# Patient Record
Sex: Female | Born: 1990 | Race: Black or African American | Hispanic: No | Marital: Single | Smoking: Current every day smoker
Health system: Southern US, Community
[De-identification: ages and names within clinical notes are randomized; demographics above are authoritative.]

## PROBLEM LIST (undated history)

## (undated) ENCOUNTER — Inpatient Hospital Stay (HOSPITAL_COMMUNITY): Payer: Self-pay

## (undated) ENCOUNTER — Emergency Department (HOSPITAL_COMMUNITY): Admission: EM | Payer: Medicaid Other | Source: Home / Self Care

## (undated) DIAGNOSIS — G4700 Insomnia, unspecified: Secondary | ICD-10-CM

## (undated) DIAGNOSIS — IMO0001 Reserved for inherently not codable concepts without codable children: Secondary | ICD-10-CM

## (undated) DIAGNOSIS — D649 Anemia, unspecified: Secondary | ICD-10-CM

## (undated) DIAGNOSIS — Z8669 Personal history of other diseases of the nervous system and sense organs: Secondary | ICD-10-CM

## (undated) DIAGNOSIS — F419 Anxiety disorder, unspecified: Secondary | ICD-10-CM

## (undated) HISTORY — PX: FOOT SURGERY: SHX648

## (undated) HISTORY — DX: Anemia, unspecified: D64.9

---
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
## 2011-02-10 LAB — CBC
Platelets: 320 10*3/uL (ref 150–400)
RDW: 15.8 % — ABNORMAL HIGH (ref 11.5–15.5)
WBC: 9.4 10*3/uL (ref 4.0–10.5)

## 2011-02-10 LAB — GC/CHLAMYDIA PROBE AMP, GENITAL
Chlamydia, DNA Probe: NEGATIVE
GC Probe Amp, Genital: NEGATIVE

## 2011-02-10 LAB — POCT PREGNANCY, URINE: Preg Test, Ur: NEGATIVE

## 2011-09-01 ENCOUNTER — Inpatient Hospital Stay (INDEPENDENT_AMBULATORY_CARE_PROVIDER_SITE_OTHER)
Admission: RE | Admit: 2011-09-01 | Discharge: 2011-09-01 | Disposition: A | Discharge status: Home / Self Care | Payer: Medicaid Other | Source: Ambulatory Visit | Attending: Family Medicine | Admitting: Family Medicine

## 2011-09-01 DIAGNOSIS — Z0489 Encounter for examination and observation for other specified reasons: Secondary | ICD-10-CM

## 2011-09-01 DIAGNOSIS — N764 Abscess of vulva: Secondary | ICD-10-CM

## 2011-09-01 LAB — POCT URINALYSIS DIP (DEVICE)
Bilirubin Urine: NEGATIVE
Glucose, UA: NEGATIVE mg/dL
Hgb urine dipstick: NEGATIVE
Ketones, ur: NEGATIVE mg/dL
Leukocytes, UA: NEGATIVE
Nitrite: NEGATIVE
Protein, ur: NEGATIVE mg/dL
Specific Gravity, Urine: 1.02 (ref 1.005–1.030)
Urobilinogen, UA: 0.2 mg/dL (ref 0.0–1.0)
pH: 7 (ref 5.0–8.0)

## 2011-09-01 LAB — WET PREP, GENITAL
Trich, Wet Prep: NONE SEEN
Yeast Wet Prep HPF POC: NONE SEEN

## 2011-09-02 LAB — GC/CHLAMYDIA PROBE AMP, GENITAL
Chlamydia, DNA Probe: NEGATIVE
GC Probe Amp, Genital: NEGATIVE

## 2011-09-04 ENCOUNTER — Ambulatory Visit: Payer: Medicaid Other | Admitting: Obstetrics & Gynecology

## 2011-09-15 ENCOUNTER — Encounter: Payer: Self-pay | Admitting: *Deleted

## 2011-09-21 ENCOUNTER — Ambulatory Visit: Payer: Medicaid Other | Admitting: Family Medicine

## 2011-12-16 ENCOUNTER — Encounter (HOSPITAL_COMMUNITY): Payer: Self-pay | Admitting: *Deleted

## 2011-12-16 ENCOUNTER — Emergency Department (INDEPENDENT_AMBULATORY_CARE_PROVIDER_SITE_OTHER)
Admission: EM | Admit: 2011-12-16 | Discharge: 2011-12-16 | Disposition: A | Discharge status: Home / Self Care | Payer: Medicaid Other | Source: Home / Self Care | Attending: Family Medicine | Admitting: Family Medicine

## 2011-12-16 DIAGNOSIS — R6889 Other general symptoms and signs: Secondary | ICD-10-CM

## 2011-12-16 MED ORDER — ONDANSETRON HCL 4 MG PO TABS: 4.0000 mg | ORAL_TABLET | Freq: Four times a day (QID) | ORAL | Status: AC

## 2011-12-16 NOTE — ED Provider Notes (Signed)
History     CSN: [id number]  Arrival date & time 12/16/11  1726   First MD Initiated Contact with Patient 12/16/11 1822      Chief Complaint  Patient presents with  . Nausea  . Emesis    (Consider location/radiation/quality/duration/timing/severity/associated sxs/prior treatment) Patient is a 21 y.o. female presenting with vomiting. The history is provided by the patient.  Emesis  This is a new problem. The current episode started more than 2 days ago (started with fever, chills aching then n/v yest with diarrhea.). The problem has been gradually worsening. The emesis has an appearance of stomach contents. There has been no fever. Pertinent negatives include no abdominal pain, no cough, no diarrhea and no URI.    Past Medical History  Diagnosis Date  . Asthma   . Anemia     History reviewed. No pertinent past surgical history.  History reviewed. No pertinent family history.  History  Substance Use Topics  . Smoking status: Current Some Day Smoker    Types: Cigars  . Smokeless tobacco: Not on file  . Alcohol Use: Yes    OB History    Grav Para Term Preterm Abortions TAB SAB Ect Mult Living                  Review of Systems  Constitutional: Negative.   HENT: Negative.   Respiratory: Negative for cough.   Gastrointestinal: Positive for nausea and vomiting. Negative for abdominal pain, diarrhea, abdominal distention and anal bleeding.  Genitourinary: Negative.     Allergies  Review of patient's allergies indicates no known allergies.  Home Medications   Current Outpatient Rx  Name Route Sig Dispense Refill  . ALBUTEROL SULFATE HFA 108 (90 BASE) MCG/ACT IN AERS Inhalation Inhale 2 puffs into the lungs every 6 (six) hours as needed.    . ALBUTEROL SULFATE (2.5 MG/3ML) 0.083% IN NEBU Nebulization Take 2.5 mg by nebulization 2 (two) times daily as needed.      . AMBULATORY NON FORMULARY MEDICATION Oral Take 1 tablet by mouth daily. Medication Name: Trisprintec     . BUDESONIDE 180 MCG/ACT IN AEPB Inhalation Inhale 1 puff into the lungs 2 (two) times daily as needed.      Marland Kitchen MONTELUKAST SODIUM 10 MG PO TABS Oral Take 10 mg by mouth daily as needed.      Marland Kitchen ONDANSETRON HCL 4 MG PO TABS Oral Take 1 tablet (4 mg total) by mouth every 6 (six) hours. 8 tablet 0    BP 137/88  Pulse 92  Temp(Src) 98.6 F (37 C) (Oral)  Resp 16  SpO2 100%  LMP 12/14/2011  Physical Exam  Nursing note and vitals reviewed. Constitutional: She appears well-developed and well-nourished.  HENT:  Head: Normocephalic.  Right Ear: External ear normal.  Left Ear: External ear normal.  Mouth/Throat: Oropharynx is clear and moist.  Neck: Normal range of motion. Neck supple.  Cardiovascular: Normal rate, normal heart sounds and intact distal pulses.   Pulmonary/Chest: Effort normal and breath sounds normal.  Abdominal: Soft. Bowel sounds are normal. She exhibits no distension. There is no tenderness. There is no rebound and no guarding.  Lymphadenopathy:    She has no cervical adenopathy.  Skin: Skin is warm and dry.    ED Course  Procedures (including critical care time)  Labs Reviewed - No data to display No results found.   1. Influenza-like illness       MDM  Barkley Bruns, MD 12/18/11 [phone]

## 2012-07-05 ENCOUNTER — Inpatient Hospital Stay (HOSPITAL_COMMUNITY): Payer: Medicaid Other

## 2012-07-05 ENCOUNTER — Encounter (HOSPITAL_COMMUNITY): Payer: Self-pay

## 2012-07-05 ENCOUNTER — Inpatient Hospital Stay (HOSPITAL_COMMUNITY)
Admission: AD | Admit: 2012-07-05 | Discharge: 2012-07-05 | Disposition: A | Discharge status: Home / Self Care | Payer: Medicaid Other | Source: Ambulatory Visit | Attending: Family Medicine | Admitting: Family Medicine

## 2012-07-05 DIAGNOSIS — O26899 Other specified pregnancy related conditions, unspecified trimester: Secondary | ICD-10-CM

## 2012-07-05 DIAGNOSIS — O99891 Other specified diseases and conditions complicating pregnancy: Secondary | ICD-10-CM | POA: Insufficient documentation

## 2012-07-05 DIAGNOSIS — R109 Unspecified abdominal pain: Secondary | ICD-10-CM | POA: Insufficient documentation

## 2012-07-05 LAB — URINALYSIS, ROUTINE W REFLEX MICROSCOPIC
Bilirubin Urine: NEGATIVE
Hgb urine dipstick: NEGATIVE
Ketones, ur: NEGATIVE mg/dL
Nitrite: NEGATIVE
Protein, ur: NEGATIVE mg/dL
Specific Gravity, Urine: >1.03 — ABNORMAL HIGH (ref 1.005–1.030)
Urobilinogen, UA: 0.2 mg/dL (ref 0.0–1.0)

## 2012-07-05 LAB — WET PREP, GENITAL: Trich, Wet Prep: NONE SEEN

## 2012-07-05 LAB — CBC WITH DIFFERENTIAL/PLATELET
Basophils Relative: 0 % (ref 0–1)
Eosinophils Absolute: 0.1 10*3/uL (ref 0.0–0.7)
Eosinophils Relative: 1 % (ref 0–5)
Lymphs Abs: 3.2 10*3/uL (ref 0.7–4.0)
MCH: 23.3 pg — ABNORMAL LOW (ref 26.0–34.0)
MCHC: 31.1 g/dL (ref 30.0–36.0)
MCV: 74.9 fL — ABNORMAL LOW (ref 78.0–100.0)
Monocytes Relative: 7 % (ref 3–12)
Neutrophils Relative %: 49 % (ref 43–77)
Platelets: 240 10*3/uL (ref 150–400)
RBC: 4.38 MIL/uL (ref 3.87–5.11)

## 2012-07-05 LAB — URINE MICROSCOPIC-ADD ON

## 2012-07-05 LAB — HCG, QUANTITATIVE, PREGNANCY: hCG, Beta Chain, Quant, S: 1023 m[IU]/mL — ABNORMAL HIGH (ref <5)

## 2012-07-05 NOTE — MAU Provider Note (Signed)
History     CSN: [id number]  Arrival date and time: 07/05/12 1346   First Provider Initiated Contact with Patient 07/05/12 1555      Chief Complaint  Patient presents with  . Abdominal Pain   HPI Tiffany [name] is a 22 y.o. female @ [redacted]w[redacted]d gestation who presents to MAU with abdominal pain. The pain started last night. She describes the pain as sharp that comes and goes. She rates the pain as 8/10. The pain is located in the left lower area of the abdomen. Associated symptoms include nausea, frequent urination and low back pain. Last pap smear = never had one. No history of STI's. Current sex partner x 2 years, no birth control. The history was provided by the patient.  OB History    Grav Para Term Preterm Abortions TAB SAB Ect Mult Living   1               Past Medical History  Diagnosis Date  . Asthma   . Anemia     Past Surgical History  Procedure Date  . No past surgeries     Family History  Problem Relation Age of Onset  . Other Neg Hx     History  Substance Use Topics  . Smoking status: Current Some Day Smoker    Types: Cigars  . Smokeless tobacco: Never Used  . Alcohol Use: No    Allergies: No Known Allergies  Prescriptions prior to admission  Medication Sig Dispense Refill  . acetaminophen (TYLENOL) 500 MG tablet Take 500 mg by mouth every 6 (six) hours as needed. For pain      . ibuprofen (ADVIL,MOTRIN) 200 MG tablet Take 400 mg by mouth every 6 (six) hours as needed. For pain      . albuterol (PROVENTIL HFA;VENTOLIN HFA) 108 (90 BASE) MCG/ACT inhaler Inhale 2 puffs into the lungs every 6 (six) hours as needed. For wheezing or shortness of breath        Review of Systems  Constitutional: Negative for fever, chills and weight loss.  HENT: Negative for ear pain, nosebleeds, congestion, sore throat and neck pain.   Eyes: Negative for blurred vision, double vision, photophobia and pain.  Respiratory: Negative for cough, shortness of breath and wheezing.     Cardiovascular: Negative for chest pain, palpitations and leg swelling.  Gastrointestinal: Positive for nausea and abdominal pain. Negative for heartburn, vomiting, diarrhea and constipation.  Genitourinary: Positive for frequency. Negative for dysuria and urgency.  Musculoskeletal: Positive for back pain. Negative for myalgias.  Skin: Negative for itching and rash.  Neurological: Positive for headaches. Negative for dizziness, sensory change, speech change, seizures and weakness.  Endo/Heme/Allergies: Does not bruise/bleed easily.  Psychiatric/Behavioral: Negative for depression. The patient is not nervous/anxious and does not have insomnia.    Physical Exam   Blood pressure 117/61, pulse 94, temperature 100 F (37.8 C), temperature source Oral, resp. rate 16, height 5\' 4"  (1.626 m), weight 142 lb (64.411 kg), last menstrual period 05/24/2012.  Physical Exam  Constitutional: She is oriented to person, place, and time. She appears well-developed and well-nourished. No distress.  HENT:  Head: Normocephalic and atraumatic.  Eyes: EOM are normal.  Neck: Neck supple.  Cardiovascular: Normal rate.   Respiratory: Effort normal.  GI: Soft. There is tenderness.       Mildly tender left lower quadrant.   Genitourinary:       External genitalia without lesions. White discharge vaginal vault. Cervix closed, long, no CMT,  mild left adnexal tenderness. Uterus slightly enlarged.   Musculoskeletal: Normal range of motion.  Neurological: She is alert and oriented to person, place, and time.  Skin: Skin is warm and dry.  Psychiatric: She has a normal mood and affect. Her behavior is normal. Judgment and thought content normal.   Results for orders placed during the hospital encounter of 07/05/12 (from the past 24 hour(s))  URINALYSIS, ROUTINE W REFLEX MICROSCOPIC     Status: Abnormal   Collection Time   07/05/12  2:00 PM      Component Value Range   Color, Urine YELLOW  YELLOW   APPearance HAZY  (*) CLEAR   Specific Gravity, Urine >1.030 (*) 1.005 - 1.030   pH 6.0  5.0 - 8.0   Glucose, UA NEGATIVE  NEGATIVE mg/dL   Hgb urine dipstick NEGATIVE  NEGATIVE   Bilirubin Urine NEGATIVE  NEGATIVE   Ketones, ur NEGATIVE  NEGATIVE mg/dL   Protein, ur NEGATIVE  NEGATIVE mg/dL   Urobilinogen, UA 0.2  0.0 - 1.0 mg/dL   Nitrite NEGATIVE  NEGATIVE   Leukocytes, UA TRACE (*) NEGATIVE  URINE MICROSCOPIC-ADD ON     Status: Abnormal   Collection Time   07/05/12  2:00 PM      Component Value Range   Squamous Epithelial / LPF MANY (*) RARE   WBC, UA 0-2  <3 WBC/hpf   RBC / HPF 0-2  <3 RBC/hpf   Urine-Other MUCOUS PRESENT    POCT PREGNANCY, URINE     Status: Abnormal   Collection Time   07/05/12  3:22 PM      Component Value Range   Preg Test, Ur POSITIVE (*) NEGATIVE  WET PREP, GENITAL     Status: Abnormal   Collection Time   07/05/12  4:11 PM      Component Value Range   Yeast Wet Prep HPF POC NONE SEEN  NONE SEEN   Trich, Wet Prep NONE SEEN  NONE SEEN   Clue Cells Wet Prep HPF POC FEW (*) NONE SEEN   WBC, Wet Prep HPF POC FEW (*) NONE SEEN  CBC WITH DIFFERENTIAL     Status: Abnormal   Collection Time   07/05/12  4:11 PM      Component Value Range   WBC 7.6  4.0 - 10.5 K/uL   RBC 4.38  3.87 - 5.11 MIL/uL   Hemoglobin 10.2 (*) 12.0 - 15.0 g/dL   HCT 29.5 (*) 62.1 - 30.8 %   MCV 74.9 (*) 78.0 - 100.0 fL   MCH 23.3 (*) 26.0 - 34.0 pg   MCHC 31.1  30.0 - 36.0 g/dL   RDW 65.7 (*) 84.6 - 96.2 %   Platelets 240  150 - 400 K/uL   Neutrophils Relative 49  43 - 77 %   Neutro Abs 3.7  1.7 - 7.7 K/uL   Lymphocytes Relative 42  12 - 46 %   Lymphs Abs 3.2  0.7 - 4.0 K/uL   Monocytes Relative 7  3 - 12 %   Monocytes Absolute 0.6  0.1 - 1.0 K/uL   Eosinophils Relative 1  0 - 5 %   Eosinophils Absolute 0.1  0.0 - 0.7 K/uL   Basophils Relative 0  0 - 1 %   Basophils Absolute 0.0  0.0 - 0.1 K/uL  HCG, QUANTITATIVE, PREGNANCY     Status: Abnormal   Collection Time   07/05/12  4:11 PM      Component  Value Range  hCG, Beta Chain, Quant, S 1023 (*) <5 mIU/mL  ABO/RH     Status: Normal   Collection Time   07/05/12  4:11 PM      Component Value Range   ABO/RH(D) A POS     US Ob Comp Less 14 Wks  07/05/2012  *RADIOLOGY REPORT*  Clinical Data: Left pelvic pain for 1 month  OBSTETRIC <14 WK [place] AND TRANSVAGINAL OB US  Technique:  Both transabdominal and transvaginal ultrasound examinations were performed for complete evaluation of the gestation as well as the maternal uterus, adnexal regions, and pelvic cul-de-sac.  Transvaginal technique was performed to assess early pregnancy.  Comparison:  None.  Intrauterine gestational sac:  Not visualized Yolk sac: Not visualized Embryo: Not visualized Cardiac Activity: Not detected  Maternal uterus/adnexae: Homogeneous thickened and echogenic endometrium measuring 17 mm. No visualized intrauterine gestational sac or IUP.  No adnexal abnormality or free fluid.  Left ovary measures 1.9 x 3.7 x 1.9 cm. Small follicles evident.  Right ovary measures 5.3 x 3.2 x 3.1 cm. Small corpus luteum cyst noted.  IMPRESSION: No visualized intrauterine gestational sac or IUP.  Thickened endometrium noted.  This may be secondary to a pregnancy too early to visualize.  No free fluid or adnexal abnormality appreciated.  Original Report Authenticated By: Judie Petit. Ruel Favors, M.D.   US Ob Transvaginal  07/05/2012  *RADIOLOGY REPORT*  Clinical Data: Left pelvic pain for 1 month  OBSTETRIC <14 WK [place] AND TRANSVAGINAL OB US  Technique:  Both transabdominal and transvaginal ultrasound examinations were performed for complete evaluation of the gestation as well as the maternal uterus, adnexal regions, and pelvic cul-de-sac.  Transvaginal technique was performed to assess early pregnancy.  Comparison:  None.  Intrauterine gestational sac:  Not visualized Yolk sac: Not visualized Embryo: Not visualized Cardiac Activity: Not detected  Maternal uterus/adnexae: Homogeneous thickened and echogenic  endometrium measuring 17 mm. No visualized intrauterine gestational sac or IUP.  No adnexal abnormality or free fluid.  Left ovary measures 1.9 x 3.7 x 1.9 cm. Small follicles evident.  Right ovary measures 5.3 x 3.2 x 3.1 cm. Small corpus luteum cyst noted.  IMPRESSION: No visualized intrauterine gestational sac or IUP.  Thickened endometrium noted.  This may be secondary to a pregnancy too early to visualize.  No free fluid or adnexal abnormality appreciated.  Original Report Authenticated By: Judie Petit. Ruel Favors, M.D.   Assessment: 21 y.o. female with abdominal pain in early pregnancy    Plan:  Return in 2 days for follow up Bhcg. If normal rise will schedule ultrasound for one week later.   Ectopic precautions.    Stop ibuprofen Procedures  Follow-up Information    Follow up with [facility] in 2 days.   Contact information:   [address] [place] [address] [phone]         [place], [place]  Home Medication Instructions [facility]:[id number]   Printed on:07/05/12 1738  Medication Information                    albuterol (PROVENTIL HFA;VENTOLIN HFA) 108 (90 BASE) MCG/ACT inhaler Inhale 2 puffs into the lungs every 6 (six) hours as needed. For wheezing or shortness of breath           acetaminophen (TYLENOL) 500 MG tablet Take 500 mg by mouth every 6 (six) hours as needed. For pain           ibuprofen (ADVIL,MOTRIN) 200 MG tablet Take 400 mg  by mouth every 6 (six) hours as needed. For pain            NEESE,HOPE, RN, FNP, [facility] 07/05/2012, 5:18 PM

## 2012-07-05 NOTE — MAU Provider Note (Signed)
Chart reviewed and agree with management and plan.  

## 2012-07-05 NOTE — MAU Note (Signed)
Pt states pain x1 month intermittently, alternates sides however mostly noted on pt's left side. Denies abnormal vaginal discharge or bleeding.

## 2012-07-05 NOTE — MAU Note (Signed)
Pt states, " I've had low abdominal pain off and on for a month and I've missed my period."

## 2012-07-06 LAB — GC/CHLAMYDIA PROBE AMP, GENITAL
Chlamydia, DNA Probe: NEGATIVE
GC Probe Amp, Genital: NEGATIVE

## 2012-07-07 ENCOUNTER — Inpatient Hospital Stay (HOSPITAL_COMMUNITY)
Admission: AD | Admit: 2012-07-07 | Discharge: 2012-07-07 | Disposition: A | Discharge status: Home / Self Care | Payer: Medicaid Other | Source: Ambulatory Visit | Attending: Obstetrics & Gynecology | Admitting: Obstetrics & Gynecology

## 2012-07-07 DIAGNOSIS — R109 Unspecified abdominal pain: Secondary | ICD-10-CM | POA: Insufficient documentation

## 2012-07-07 DIAGNOSIS — Z331 Pregnant state, incidental: Secondary | ICD-10-CM

## 2012-07-07 DIAGNOSIS — Z3490 Encounter for supervision of normal pregnancy, unspecified, unspecified trimester: Secondary | ICD-10-CM

## 2012-07-07 DIAGNOSIS — O99891 Other specified diseases and conditions complicating pregnancy: Secondary | ICD-10-CM | POA: Insufficient documentation

## 2012-07-07 NOTE — MAU Note (Signed)
Patient to MAU for follow up BHCG for follow up BHCG. Patient states she is having some sharp/achey lower abdominal pain on and off but no bleeding or discharge.

## 2012-07-07 NOTE — MAU Provider Note (Signed)
  History     CSN: [id number]  Arrival date and time: 07/07/12 1316   None     Chief Complaint  Patient presents with  . Follow-up   HPI This is a 21 y.o. female at [redacted]w[redacted]d who presents for follow-up Quant HCG level. Denies complaints  OB History    Grav Para Term Preterm Abortions TAB SAB Ect Mult Living   1               Past Medical History  Diagnosis Date  . Asthma   . Anemia     Past Surgical History  Procedure Date  . No past surgeries     Family History  Problem Relation Age of Onset  . Other Neg Hx     History  Substance Use Topics  . Smoking status: Current Some Day Smoker    Types: Cigars  . Smokeless tobacco: Never Used  . Alcohol Use: No    Allergies: No Known Allergies  Prescriptions prior to admission  Medication Sig Dispense Refill  . acetaminophen (TYLENOL) 500 MG tablet Take 500 mg by mouth every 6 (six) hours as needed. For pain      . albuterol (PROVENTIL HFA;VENTOLIN HFA) 108 (90 BASE) MCG/ACT inhaler Inhale 2 puffs into the lungs every 6 (six) hours as needed. For wheezing or shortness of breath      . DISCONTD: ibuprofen (ADVIL,MOTRIN) 200 MG tablet Take 400 mg by mouth every 6 (six) hours as needed. For pain        ROS Denies  Physical Exam   Blood pressure 122/66, pulse 98, temperature 98.4 F (36.9 C), temperature source Oral, resp. rate 16, last menstrual period 05/24/2012, SpO2 100.00%.  Physical Exam  Constitutional: She is oriented to person, place, and time. She appears well-developed and well-nourished. No distress.  Respiratory: Effort normal.  Neurological: She is alert and oriented to person, place, and time.  Skin: Skin is warm and dry.  Psychiatric: She has a normal mood and affect.   Results for orders placed during the hospital encounter of 07/07/12 (from the past 24 hour(s))  HCG, QUANTITATIVE, PREGNANCY     Status: Abnormal   Collection Time   07/07/12  1:27 PM      Component Value Range   hCG, Beta Chain,  Quant, S 2861 (*) <5 mIU/mL   Last Quant 1021   MAU Course  Procedures  Assessment and Plan  A:  Pregnancy at [redacted]w[redacted]d by LMP      Appropriately rising Quant HCG  P:  Per previous plan, since Quant rose appropriately today, will have her return Tuesday for F/U US  [facility] 07/07/2012, 2:14 PM

## 2012-07-12 ENCOUNTER — Inpatient Hospital Stay (HOSPITAL_COMMUNITY): Payer: Medicaid Other

## 2012-07-12 ENCOUNTER — Inpatient Hospital Stay (HOSPITAL_COMMUNITY)
Admission: AD | Admit: 2012-07-12 | Discharge: 2012-07-12 | Disposition: A | Discharge status: Home / Self Care | Payer: Medicaid Other | Source: Ambulatory Visit | Attending: Obstetrics & Gynecology | Admitting: Obstetrics & Gynecology

## 2012-07-12 DIAGNOSIS — O200 Threatened abortion: Secondary | ICD-10-CM

## 2012-07-12 NOTE — MAU Note (Signed)
Patient to MAU for a follow up ultrasound for viability. Patient denies any pain or bleeding.

## 2012-07-12 NOTE — MAU Provider Note (Signed)
  History     CSN: [id number]  Arrival date and time: 07/12/12 1200   None     Chief Complaint  Patient presents with  . Follow-up   HPI 21 y.o. G1P0 at [redacted]w[redacted]d here for f/u u/s. Seen in MAU originally with abdominal pain, had normal rise in quant, denies pain or bleeding today.    Past Medical History  Diagnosis Date  . Asthma   . Anemia     Past Surgical History  Procedure Date  . No past surgeries     Family History  Problem Relation Age of Onset  . Other Neg Hx     History  Substance Use Topics  . Smoking status: Current Some Day Smoker    Types: Cigars  . Smokeless tobacco: Never Used  . Alcohol Use: No    Allergies: No Known Allergies  Prescriptions prior to admission  Medication Sig Dispense Refill  . acetaminophen (TYLENOL) 500 MG tablet Take 500 mg by mouth every 6 (six) hours as needed. For pain      . albuterol (PROVENTIL HFA;VENTOLIN HFA) 108 (90 BASE) MCG/ACT inhaler Inhale 2 puffs into the lungs every 6 (six) hours as needed. For wheezing or shortness of breath        Review of Systems  Constitutional: Negative.   Respiratory: Negative.   Cardiovascular: Negative.   Gastrointestinal: Negative for nausea, vomiting, abdominal pain, diarrhea and constipation.  Genitourinary: Negative for dysuria, urgency, frequency, hematuria and flank pain.       Negative for vaginal bleeding, cramping/contractions  Musculoskeletal: Negative.   Neurological: Negative.   Psychiatric/Behavioral: Negative.    Physical Exam   Blood pressure 119/66, pulse 88, temperature 98.1 F (36.7 C), temperature source Oral, resp. rate 16, last menstrual period 05/24/2012, SpO2 100.00%.  Physical Exam  Nursing note and vitals reviewed. Constitutional: She is oriented to person, place, and time. She appears well-developed and well-nourished. No distress.  Cardiovascular: Normal rate.   Respiratory: Effort normal.  Neurological: She is alert and oriented to person, place,  and time.  Skin: Skin is warm and dry.  Psychiatric: She has a normal mood and affect.    MAU Course  Procedures   Assessment and Plan  21 y.o. G1P0 at at 5.2 weeks by u/s - guarded prognosis d/t irregular sac with chorionic bump F/U 10 days for repeat u/s, precautions rev'd  Jayvan Mcshan 07/12/2012, 12:55 PM

## 2012-07-14 NOTE — MAU Provider Note (Signed)
Attestation of Attending Supervision of Advanced Practitioner (CNM/NP): Evaluation and management procedures were performed by the Advanced Practitioner under my supervision and collaboration.  I have reviewed the Advanced Practitioner's note and chart, and I agree with the management and plan.  HARRAWAY-SMITH, Julea Hutto 9:05 AM     

## 2012-07-22 ENCOUNTER — Inpatient Hospital Stay (HOSPITAL_COMMUNITY)
Admission: AD | Admit: 2012-07-22 | Discharge: 2012-07-22 | Disposition: A | Discharge status: Home / Self Care | Payer: Medicaid Other | Source: Ambulatory Visit | Attending: Obstetrics & Gynecology | Admitting: Obstetrics & Gynecology

## 2012-07-22 ENCOUNTER — Ambulatory Visit (HOSPITAL_COMMUNITY)
Admit: 2012-07-22 | Discharge: 2012-07-22 | Disposition: A | Discharge status: Home / Self Care | Payer: Medicaid Other | Attending: Obstetrics & Gynecology | Admitting: Obstetrics & Gynecology

## 2012-07-22 DIAGNOSIS — O99330 Smoking (tobacco) complicating pregnancy, unspecified trimester: Secondary | ICD-10-CM | POA: Insufficient documentation

## 2012-07-22 DIAGNOSIS — O200 Threatened abortion: Secondary | ICD-10-CM

## 2012-07-22 DIAGNOSIS — O209 Hemorrhage in early pregnancy, unspecified: Secondary | ICD-10-CM | POA: Insufficient documentation

## 2012-07-22 DIAGNOSIS — Z1389 Encounter for screening for other disorder: Secondary | ICD-10-CM

## 2012-07-22 DIAGNOSIS — O3680X0 Pregnancy with inconclusive fetal viability, not applicable or unspecified: Secondary | ICD-10-CM | POA: Insufficient documentation

## 2012-07-22 DIAGNOSIS — Z3490 Encounter for supervision of normal pregnancy, unspecified, unspecified trimester: Secondary | ICD-10-CM

## 2012-07-22 NOTE — MAU Provider Note (Signed)
Attestation of Attending Supervision of Advanced Practitioner (CNM/NP): Evaluation and management procedures were performed by the Advanced Practitioner under my supervision and collaboration.  I have reviewed the Advanced Practitioner's note and chart, and I agree with the management and plan.  HARRAWAY-SMITH, Blaize Nipper 3:32 PM     

## 2012-07-22 NOTE — MAU Provider Note (Signed)
History   Chief Complaint:  No chief complaint on file.   Tiffany [name] is  21 y.o. G1P0 Patient's last menstrual period was 05/24/2012.Marland Kitchen Patient is here for follow up of quantitative HCG and ongoing surveillance of pregnancy status.   She is [redacted]w[redacted]d weeks gestation  by LMP.    Since her last visit, the patient is without new complaint.   The patient reports bleeding as  none now.    General ROS:  negative    Physical Exam   Last menstrual period 05/24/2012.  Focused Gynecological Exam: examination not indicated  Ultrasound Studies:   IUP: [redacted]w[redacted]d + Cardiac activity, small  Assessment: 1. Normal IUP (intrauterine pregnancy) on prenatal ultrasound    Plan: Discharge home Reviewed [place] result with [facility] finding Referral for MCD and PNC given  Tiffany [name] E. 07/22/2012, 12:05 PM

## 2012-08-15 ENCOUNTER — Inpatient Hospital Stay (HOSPITAL_COMMUNITY)
Admission: AD | Admit: 2012-08-15 | Discharge: 2012-08-15 | Disposition: A | Discharge status: Home / Self Care | Payer: Medicaid Other | Source: Ambulatory Visit | Attending: Family Medicine | Admitting: Family Medicine

## 2012-08-15 ENCOUNTER — Encounter (HOSPITAL_COMMUNITY): Payer: Self-pay | Admitting: *Deleted

## 2012-08-15 DIAGNOSIS — O99891 Other specified diseases and conditions complicating pregnancy: Secondary | ICD-10-CM | POA: Insufficient documentation

## 2012-08-15 DIAGNOSIS — Z3490 Encounter for supervision of normal pregnancy, unspecified, unspecified trimester: Secondary | ICD-10-CM

## 2012-08-15 DIAGNOSIS — Z331 Pregnant state, incidental: Secondary | ICD-10-CM

## 2012-08-15 DIAGNOSIS — N949 Unspecified condition associated with female genital organs and menstrual cycle: Secondary | ICD-10-CM | POA: Insufficient documentation

## 2012-08-15 DIAGNOSIS — O210 Mild hyperemesis gravidarum: Secondary | ICD-10-CM | POA: Insufficient documentation

## 2012-08-15 DIAGNOSIS — R109 Unspecified abdominal pain: Secondary | ICD-10-CM | POA: Insufficient documentation

## 2012-08-15 DIAGNOSIS — O219 Vomiting of pregnancy, unspecified: Secondary | ICD-10-CM

## 2012-08-15 LAB — URINALYSIS, ROUTINE W REFLEX MICROSCOPIC
Bilirubin Urine: NEGATIVE
Glucose, UA: NEGATIVE mg/dL
Hgb urine dipstick: NEGATIVE
Specific Gravity, Urine: 1.015 (ref 1.005–1.030)

## 2012-08-15 LAB — POCT PREGNANCY, URINE: Preg Test, Ur: POSITIVE — AB

## 2012-08-15 MED ORDER — PROMETHAZINE HCL 12.5 MG PO TABS: 12.5000 mg | ORAL_TABLET | Freq: Four times a day (QID) | ORAL | Status: DC | PRN

## 2012-08-15 NOTE — MAU Note (Signed)
Unable to doppler FHR 

## 2012-08-15 NOTE — MAU Provider Note (Signed)
History     CSN: [id number]  Arrival date and time: 08/15/12 1206   First Provider Initiated Contact with Patient 08/15/12 1301      Chief Complaint  Patient presents with  . Vaginal Discharge   HPI Tiffany [name] is 21 y.o. G1P0 [redacted]w[redacted]d weeks presenting with vaginal discharge and abdominal pain X 10 days.  2 diarrheal stools today.   Nausea.  Plans prenatal care with [facility]--has appt in 2 days.      Past Medical History  Diagnosis Date  . Asthma   . Anemia     Past Surgical History  Procedure Date  . No past surgeries     Family History  Problem Relation Age of Onset  . Other Neg Hx   . Alcohol abuse Neg Hx   . Arthritis Neg Hx   . Asthma Neg Hx   . Birth defects Neg Hx   . Cancer Neg Hx   . COPD Neg Hx   . Depression Neg Hx   . Diabetes Neg Hx   . Drug abuse Neg Hx   . Early death Neg Hx   . Hearing loss Neg Hx   . Heart disease Neg Hx   . Hyperlipidemia Neg Hx   . Hypertension Neg Hx   . Kidney disease Neg Hx   . Learning disabilities Neg Hx   . Mental illness Neg Hx   . Mental retardation Neg Hx   . Miscarriages / Stillbirths Neg Hx   . Stroke Neg Hx   . Vision loss Neg Hx     History  Substance Use Topics  . Smoking status: Current Every Day Smoker -- 1 years    Types: Cigars  . Smokeless tobacco: Never Used  . Alcohol Use: No    Allergies: No Known Allergies  Prescriptions prior to admission  Medication Sig Dispense Refill  . Prenatal Vit-Fe Fumarate-FA (PRENATAL MULTIVITAMIN) TABS Take 1 tablet by mouth every morning.      Marland Kitchen albuterol (PROVENTIL HFA;VENTOLIN HFA) 108 (90 BASE) MCG/ACT inhaler Inhale 2 puffs into the lungs every 6 (six) hours as needed. For wheezing or shortness of breath        Review of Systems  Constitutional: Negative.   Respiratory: Negative.   Cardiovascular: Negative.   Gastrointestinal: Positive for abdominal pain.  Genitourinary:       + for vaginal discharge   Physical Exam   Blood pressure 114/56,  pulse 89, temperature 97.8 F (36.6 C), temperature source Oral, resp. rate 16, height 5\' 3"  (1.6 m), weight 64.864 kg (143 lb), last menstrual period 05/24/2012.  Physical Exam  Constitutional: She appears well-developed and well-nourished. No distress.  HENT:  Head: Normocephalic.  Neck: Normal range of motion.  Cardiovascular: Normal rate.   Respiratory: Effort normal.  GI: Soft. She exhibits no distension and no mass. There is no tenderness. There is no rebound and no guarding.  Genitourinary: Uterus is enlarged (10-11 week size). Uterus is not tender. Cervix exhibits no discharge and no friability. Right adnexum displays no tenderness. Left adnexum displays no tenderness. No bleeding around the vagina. Vaginal discharge: white discharge without odor.   Results for orders placed during the hospital encounter of 08/15/12 (from the past 24 hour(s))  URINALYSIS, ROUTINE W REFLEX MICROSCOPIC     Status: Normal   Collection Time   08/15/12 12:20 PM      Component Value Range   Color, Urine YELLOW  YELLOW   APPearance CLEAR  CLEAR  Specific Gravity, Urine 1.015  1.005 - 1.030   pH 7.5  5.0 - 8.0   Glucose, UA NEGATIVE  NEGATIVE mg/dL   Hgb urine dipstick NEGATIVE  NEGATIVE   Bilirubin Urine NEGATIVE  NEGATIVE   Ketones, ur NEGATIVE  NEGATIVE mg/dL   Protein, ur NEGATIVE  NEGATIVE mg/dL   Urobilinogen, UA 0.2  0.0 - 1.0 mg/dL   Nitrite NEGATIVE  NEGATIVE   Leukocytes, UA NEGATIVE  NEGATIVE  POCT PREGNANCY, URINE     Status: Abnormal   Collection Time   08/15/12 12:25 PM      Component Value Range   Preg Test, Ur POSITIVE (*) NEGATIVE  WET PREP, GENITAL     Status: Abnormal   Collection Time   08/15/12  1:40 PM      Component Value Range   Yeast Wet Prep HPF POC NONE SEEN  NONE SEEN   Trich, Wet Prep NONE SEEN  NONE SEEN   Clue Cells Wet Prep HPF POC NONE SEEN  NONE SEEN   WBC, Wet Prep HPF POC FEW (*) NONE SEEN    BEDSIDE ultrasound shows + cardiac activity with fetal movement   MAU Course  Procedures  MDM   Assessment and Plan  A:  Viable intrauterine pregnancy Nausea  P:  Patient reassured     Rx for Phenergan 12.5mg  tabs      Encouraged to keep her scheduled appointment with [facility] for Wednesday.  Jerilynn Feldmeier,EVE M 08/15/2012, 1:02 PM

## 2012-08-15 NOTE — MAU Provider Note (Signed)
Chart reviewed and agree with management and plan.  

## 2012-08-15 NOTE — MAU Note (Signed)
Discharge and cramping for past 1.5 weeks; 1st appointment for prenatal is 08-17-12 with [facility];

## 2012-08-16 LAB — GC/CHLAMYDIA PROBE AMP, GENITAL
Chlamydia, DNA Probe: NEGATIVE
GC Probe Amp, Genital: NEGATIVE

## 2012-09-05 LAB — OB RESULTS CONSOLE RUBELLA ANTIBODY, IGM: Rubella: IMMUNE

## 2012-09-05 LAB — OB RESULTS CONSOLE ANTIBODY SCREEN: Antibody Screen: NEGATIVE

## 2012-09-05 LAB — OB RESULTS CONSOLE HEPATITIS B SURFACE ANTIGEN: Hepatitis B Surface Ag: NEGATIVE

## 2012-09-26 ENCOUNTER — Encounter (HOSPITAL_COMMUNITY): Payer: Self-pay

## 2012-09-26 ENCOUNTER — Inpatient Hospital Stay (HOSPITAL_COMMUNITY)
Admission: AD | Admit: 2012-09-26 | Discharge: 2012-09-26 | Disposition: A | Discharge status: Home / Self Care | Payer: Medicaid Other | Source: Ambulatory Visit | Attending: Obstetrics and Gynecology | Admitting: Obstetrics and Gynecology

## 2012-09-26 DIAGNOSIS — O210 Mild hyperemesis gravidarum: Secondary | ICD-10-CM | POA: Insufficient documentation

## 2012-09-26 DIAGNOSIS — K219 Gastro-esophageal reflux disease without esophagitis: Secondary | ICD-10-CM

## 2012-09-26 DIAGNOSIS — O26899 Other specified pregnancy related conditions, unspecified trimester: Secondary | ICD-10-CM

## 2012-09-26 DIAGNOSIS — R0609 Other forms of dyspnea: Secondary | ICD-10-CM | POA: Insufficient documentation

## 2012-09-26 DIAGNOSIS — R197 Diarrhea, unspecified: Secondary | ICD-10-CM

## 2012-09-26 DIAGNOSIS — R0989 Other specified symptoms and signs involving the circulatory and respiratory systems: Secondary | ICD-10-CM | POA: Insufficient documentation

## 2012-09-26 DIAGNOSIS — R0600 Dyspnea, unspecified: Secondary | ICD-10-CM

## 2012-09-26 DIAGNOSIS — O99891 Other specified diseases and conditions complicating pregnancy: Secondary | ICD-10-CM | POA: Insufficient documentation

## 2012-09-26 DIAGNOSIS — R0602 Shortness of breath: Secondary | ICD-10-CM | POA: Insufficient documentation

## 2012-09-26 DIAGNOSIS — R079 Chest pain, unspecified: Secondary | ICD-10-CM | POA: Insufficient documentation

## 2012-09-26 MED ORDER — LACTINEX PO CHEW: 1.0000 | CHEWABLE_TABLET | Freq: Three times a day (TID) | ORAL | Status: DC

## 2012-09-26 MED ORDER — ALBUTEROL SULFATE (5 MG/ML) 0.5% IN NEBU
2.5000 mg | INHALATION_SOLUTION | Freq: Once | RESPIRATORY_TRACT | Status: AC
  Administered 2012-09-26: 2.5 mg via RESPIRATORY_TRACT
  Filled 2012-09-26: qty 0.5

## 2012-09-26 MED ORDER — ALBUTEROL SULFATE HFA 108 (90 BASE) MCG/ACT IN AERS: 2.0000 | INHALATION_SPRAY | Freq: Four times a day (QID) | RESPIRATORY_TRACT | Status: DC | PRN

## 2012-09-26 MED ORDER — GI COCKTAIL ~~LOC~~
30.0000 mL | Freq: Once | ORAL | Status: AC
  Administered 2012-09-26: 30 mL via ORAL
  Filled 2012-09-26: qty 30

## 2012-09-26 MED ORDER — OMEPRAZOLE 20 MG PO CPDR: 20.0000 mg | DELAYED_RELEASE_CAPSULE | Freq: Every day | ORAL | Status: DC

## 2012-09-26 MED ORDER — ACETAMINOPHEN 500 MG PO TABS
1000.0000 mg | ORAL_TABLET | Freq: Once | ORAL | Status: DC
  Filled 2012-09-26: qty 2

## 2012-09-26 MED ORDER — IBUPROFEN 800 MG PO TABS
800.0000 mg | ORAL_TABLET | Freq: Once | ORAL | Status: AC
  Administered 2012-09-26: 800 mg via ORAL
  Filled 2012-09-26: qty 1

## 2012-09-26 NOTE — Discharge Instructions (Signed)
Take Prilosec daily for reflux and vomiting.  Take tylenol for pain (no more than 4000 mg in 24 hours) or use heat/ice for comfort. Check your prenatal vitamins. If they contain Colace or Docusate, stop taking them and ask pharmacist for vitamins without this ingredient. Take Lactinex (probiotics) three times a day.   Heartburn During Pregnancy  Heartburn is a burning sensation in the chest caused by stomach acid backing up into the esophagus. Heartburn (also known as "reflux") is common in pregnancy because a certain hormone (progesterone) changes. The progesterone hormone may relax the valve that separates the esophagus from the stomach. This allows acid to go up into the esophagus, causing heartburn. Heartburn may also happen in pregnancy because the enlarging uterus pushes up on the stomach, which pushes more acid into the esophagus. This is especially true in the later stages of pregnancy. Heartburn problems usually go away after giving birth. CAUSES   The progesterone hormone.  Changing hormone levels.  The growing uterus that pushes stomach acid upward.  Large meals.  Certain foods and drinks.  Exercise.  Increased acid production. SYMPTOMS   Burning pain in the chest or lower throat.  Bitter taste in the mouth.  Coughing. DIAGNOSIS  Heartburn is typically diagnosed by your caregiver when taking a careful history of your concern. Your caregiver may order a blood test to check for a certain type of bacteria that is associated with heartburn. Sometimes, heartburn is diagnosed by prescribing a heartburn medicine to see if the symptoms improve. It is rare in pregnancy to have a procedure called an endoscopy. This is when a tube with a light and a camera on the end is used to examine the esophagus and the stomach. TREATMENT   Your caregiver may tell you to use certain over-the-counter medicines (antacids, acid reducers) for mild heartburn.  Your caregiver may prescribe medicines to  decrease stomach acid or to protect your stomach lining.  Your caregiver may recommend certain diet changes.  For severe cases, your caregiver may recommend that the head of the bed be elevated on blocks. (Sleeping with more pillows is not an effective treatment as it only changes the position of your head and does not improve the main problem of stomach acid refluxing into the esophagus.) HOME CARE INSTRUCTIONS   Take all medicines as directed by your caregiver.  Raise the head of your bed by putting blocks under the legs if instructed to by your caregiver.  Do not exercise right after eating.  Avoid eating 2 or 3 hours before bed. Do not lie down right after eating.  Eat small meals throughout the day instead of 3 large meals.  Identify foods and beverages that make your symptoms worse and avoid them. Foods you may want to avoid include:  Peppers.  Chocolate.  High-fat foods, including fried foods.  Spicy foods.  Garlic and onions.  Citrus fruits, including oranges, grapefruit, lemons, and limes.  Food containing tomatoes or tomato products.  Mint.  Carbonated and caffeinated drinks.  Vinegar. SEEK IMMEDIATE MEDICAL CARE IF:   You have severe chest pain that goes down your arm or into your jaw or neck.  You feel sweaty, dizzy, or lightheaded.  You become short of breath.  You vomit blood.  You have difficulty or pain with swallowing.  You have bloody or black, tarry stools.  You have episodes of heartburn more than 3 times a week, for more than 2 weeks. MAKE SURE YOU:  Understand these instructions.  Will  watch your condition.  Will get help right away if you are not doing well or get worse. Document Released: 11/13/2000 Document Revised: 02/08/2012 Document Reviewed: 05/07/2011 [facility] Patient Information 2013 [place], [place].   Costochondritis Costochondritis is a condition in which the tissue (cartilage) that connects your ribs with your  breastbone (sternum) becomes irritated and causes chest pain.  HOME CARE  Avoid activities that wear you out.  Do not strain your ribs. Avoid activities that use your:  Chest.  Belly.  Side muscles.  Put ice on the area.  Put ice in a plastic bag.  Place a towel betwen your skin and the bag.  Leave the ice on for 15 to 20 minutes, 3 to 4 times a day.  Only take medicine as told by your doctor. GET HELP RIGHT AWAY IF:   Your pain gets worse.  You are very uncomfortable.  You have a fever.  You have trouble breathing.  You cough up blood.  You start sweating or throwing up (vomiting).  You develop new, unexplained symptoms. MAKE SURE YOU:   Understand these instructions.  Will watch your condition.  Will get help right away if you are not doing well or get worse. Document Released: 05/04/2008 Document Revised: 02/08/2012 Document Reviewed: 05/04/2008 [facility] Patient Information 2013 [place], [place].

## 2012-09-26 NOTE — MAU Note (Signed)
Pt reports it hurts on the rt side of her chest when she breathes in for the last 2 days.. Denies cough, fever. Has history of asthma but hasn't needed meds for last 2 yrs.

## 2012-09-26 NOTE — MAU Provider Note (Signed)
History     CSN: [id number]  Arrival date and time: 09/26/12 1951   None     Chief Complaint  Patient presents with  . Shortness of Breath   HPI 21 y.o. G1P0 at [redacted]w[redacted]d with pain in chest on R upper side under clavicle near sternum x 2 days. Pain is with deep inspiration. Does not hurt with movement or when just resting. Feels short of breath - hard to breathe in, feels she cannot get air in or take a deep breath. Hx asthma but no wheezing or cough. Has been vomiting daily for past several weeks. No nausea. No fever/chills. Yesterday vomited several times and retched. No nausea today and eating/drinking normally. Had burning in back of throat today (without nausea/vomitin). Also has diarrhea for over a week. About 3 times a day. No recent antibiotics. Does not know what kind of PNV she is taking.   No vag bleeding, cramping or lof.   OB History    Grav Para Term Preterm Abortions TAB SAB Ect Mult Living   1               Past Medical History  Diagnosis Date  . Asthma   . Anemia     Past Surgical History  Procedure Date  . No past surgeries     Family History  Problem Relation Age of Onset  . Other Neg Hx   . Alcohol abuse Neg Hx   . Arthritis Neg Hx   . Asthma Neg Hx   . Birth defects Neg Hx   . Cancer Neg Hx   . COPD Neg Hx   . Depression Neg Hx   . Diabetes Neg Hx   . Drug abuse Neg Hx   . Early death Neg Hx   . Hearing loss Neg Hx   . Heart disease Neg Hx   . Hyperlipidemia Neg Hx   . Hypertension Neg Hx   . Kidney disease Neg Hx   . Learning disabilities Neg Hx   . Mental illness Neg Hx   . Mental retardation Neg Hx   . Miscarriages / Stillbirths Neg Hx   . Stroke Neg Hx   . Vision loss Neg Hx     History  Substance Use Topics  . Smoking status: Current Every Day Smoker -- 1 years    Types: Cigars  . Smokeless tobacco: Never Used  . Alcohol Use: No    Allergies: No Known Allergies  Prescriptions prior to admission  Medication Sig Dispense  Refill  . albuterol (PROVENTIL HFA;VENTOLIN HFA) 108 (90 BASE) MCG/ACT inhaler Inhale 2 puffs into the lungs every 6 (six) hours as needed. For wheezing or shortness of breath      . Prenatal Vit-Fe Fumarate-FA (PRENATAL MULTIVITAMIN) TABS Take 1 tablet by mouth every morning.      . promethazine (PHENERGAN) 12.5 MG tablet Take 1 tablet (12.5 mg total) by mouth every 6 (six) hours as needed for nausea.  30 tablet  0  Does not have albuterol inhaler - not using Not taking phenergan  Review of Systems  Constitutional: Negative for fever, chills and malaise/fatigue.  Eyes: Negative for blurred vision and double vision.  Respiratory: Positive for shortness of breath. Negative for cough, sputum production and wheezing.   Cardiovascular: Positive for chest pain. Negative for palpitations and PND.  Gastrointestinal: Positive for nausea and diarrhea. Negative for heartburn, vomiting, constipation and blood in stool.  Genitourinary: Negative for dysuria, urgency and frequency.  Musculoskeletal: Negative  for back pain.  Neurological: Negative for dizziness, weakness and headaches.   Physical Exam   Blood pressure 134/76, pulse 98, temperature 98.4 F (36.9 C), temperature source Oral, resp. rate 18, height 5\' 3"  (1.6 m), weight 68.947 kg (152 lb), last menstrual period 05/24/2012, SpO2 100.00%.  Physical Exam  Constitutional: She is oriented to person, place, and time. She appears well-developed and well-nourished. No distress.  HENT:  Head: Normocephalic and atraumatic.  Eyes: Conjunctivae normal and EOM are normal.  Neck: Normal range of motion. Neck supple.  Cardiovascular: Normal rate, regular rhythm and normal heart sounds.   Respiratory: Breath sounds normal. No respiratory distress. She has no wheezes. She exhibits tenderness (Right at sterno-clavicular area).       Normal effort but has trouble taking deep breath - pauses and has to try hard. No retractions. No prolonged expiratory phase.   GI: Soft. Bowel sounds are normal. There is no tenderness. There is no rebound and no guarding.  Musculoskeletal: Normal range of motion. She exhibits no edema and no tenderness.  Neurological: She is alert and oriented to person, place, and time.  Skin: Skin is dry.  Psychiatric: She has a normal mood and affect.   Fetal heart rate by doppler 155 bpm  MAU Course  Procedures  GI cocktail and 800 mg motrin given in MAU. Condition improved somewhat. Albuterol treatment given at pt request:  No improvement.  Assessment and Plan  21 y.o. G1P0 at [redacted]w[redacted]d with 1.  Chest pain - costochondritis or strained muscle from retching/vomiting. 2. Dyspnea - likely dyspnea of pregnancy exacerbated by reflux/vomiting.  Prilosec. Albuterol inhaler prescribed for home use in case of asthma/wheezing. 3.  Diarrhea - post-infectious vs lactose intolerance. Probiotics, dietary precautions 4.  Vomiting - likely due to reflux.  Discussed with Dr. Tenny Craw.  Napoleon Form 09/26/2012, 8:41 PM

## 2012-09-26 NOTE — MAU Note (Signed)
Patient is in with c/o right upper chest pain (she describes as pressure) expecially during inspiration for 3 days. Patient does not appear short of breath or having trouble breathing. She does have a history of asthma that she have not used her inhalers in years. She denies vaginal bleeding or discharge.

## 2012-11-26 ENCOUNTER — Encounter (HOSPITAL_COMMUNITY): Payer: Self-pay | Admitting: Obstetrics and Gynecology

## 2012-11-26 ENCOUNTER — Inpatient Hospital Stay (HOSPITAL_COMMUNITY)
Admission: AD | Admit: 2012-11-26 | Discharge: 2012-11-26 | Disposition: A | Discharge status: Home / Self Care | Payer: Medicaid Other | Source: Ambulatory Visit | Attending: Obstetrics and Gynecology | Admitting: Obstetrics and Gynecology

## 2012-11-26 DIAGNOSIS — K920 Hematemesis: Secondary | ICD-10-CM | POA: Insufficient documentation

## 2012-11-26 DIAGNOSIS — O212 Late vomiting of pregnancy: Secondary | ICD-10-CM | POA: Insufficient documentation

## 2012-11-26 LAB — URINALYSIS, ROUTINE W REFLEX MICROSCOPIC
Bilirubin Urine: NEGATIVE
Ketones, ur: 15 mg/dL — AB
Nitrite: NEGATIVE
Urobilinogen, UA: 0.2 mg/dL (ref 0.0–1.0)
pH: 6.5 (ref 5.0–8.0)

## 2012-11-26 LAB — COMPREHENSIVE METABOLIC PANEL
ALT: 16 U/L (ref 0–35)
Alkaline Phosphatase: 45 U/L (ref 39–117)
CO2: 24 mEq/L (ref 19–32)
Chloride: 100 mEq/L (ref 96–112)
GFR calc Af Amer: >90 mL/min (ref >90)
GFR calc non Af Amer: >90 mL/min (ref >90)
Glucose, Bld: 100 mg/dL — ABNORMAL HIGH (ref 70–99)
Potassium: 4.2 mEq/L (ref 3.5–5.1)
Sodium: 133 mEq/L — ABNORMAL LOW (ref 135–145)
Total Bilirubin: 0.2 mg/dL — ABNORMAL LOW (ref 0.3–1.2)

## 2012-11-26 LAB — CBC
Hemoglobin: 10.6 g/dL — ABNORMAL LOW (ref 12.0–15.0)
RBC: 3.41 MIL/uL — ABNORMAL LOW (ref 3.87–5.11)

## 2012-11-26 MED ORDER — FAMOTIDINE 20 MG PO TABS
40.0000 mg | ORAL_TABLET | Freq: Once | ORAL | Status: AC
  Administered 2012-11-26: 40 mg via ORAL
  Filled 2012-11-26: qty 2

## 2012-11-26 MED ORDER — ONDANSETRON HCL 4 MG PO TABS
8.0000 mg | ORAL_TABLET | Freq: Once | ORAL | Status: AC
  Administered 2012-11-26: 8 mg via ORAL
  Filled 2012-11-26: qty 2

## 2012-11-26 NOTE — MAU Note (Signed)
Pt presents to MAU with chief complaint of N/V and diarrhea. Pt is [redacted]w[redacted]d- receives prenatal care with [facility].  Pt says she has diarrhea everyday times 3 weeks. She has 3-5 liquid stools a day. Pt is eating normally, however eats a diet high in fat. Yesterday she states she had steak and shake for dinner and french fries for lunch. No breakfast. Pt says she has always tolerated a diet like this.

## 2012-11-26 NOTE — MAU Note (Addendum)
Pt reports she has been having n/v through out the pregnancy. Stated she has been throwing up blood for the past week. Given nausea medication stated it only makes he sick and does not help. Pt also reporting having diarrhea x 3 weeks

## 2012-11-26 NOTE — MAU Provider Note (Signed)
History     CSN: [id number]  Arrival date and time: 11/26/12 1302   First Provider Initiated Contact with Patient 11/26/12 1400      Chief Complaint  Patient presents with  . Emesis During Pregnancy   HPI  Tiffany [name] is a 21 y.o. G1P0000 at [redacted]w[redacted]d who presents today with vomiting blood. She states that 2 weeks ago she started vomiting blood with streaks. It happened twice 2 weeks ago, and it happened 3 times last week. She states today she vomited about a lemon size spot of mucousy blood first thing this morning, and she had an empty stomach at the time. She states she has to "strain" really hard to vomit because nothing comes up. She said she called her doctors office and they told her to come here. Her last OB appointment was 11/10/12. She last ate at 11:00 today, and was able to keep that down.   Past Medical History  Diagnosis Date  . Asthma   . Anemia     Past Surgical History  Procedure Date  . No past surgeries     Family History  Problem Relation Age of Onset  . Other Neg Hx   . Alcohol abuse Neg Hx   . Arthritis Neg Hx   . Asthma Neg Hx   . Birth defects Neg Hx   . Cancer Neg Hx   . COPD Neg Hx   . Depression Neg Hx   . Diabetes Neg Hx   . Drug abuse Neg Hx   . Early death Neg Hx   . Hearing loss Neg Hx   . Heart disease Neg Hx   . Hyperlipidemia Neg Hx   . Hypertension Neg Hx   . Kidney disease Neg Hx   . Learning disabilities Neg Hx   . Mental illness Neg Hx   . Mental retardation Neg Hx   . Miscarriages / Stillbirths Neg Hx   . Stroke Neg Hx   . Vision loss Neg Hx     History  Substance Use Topics  . Smoking status: Current Every Day Smoker -- 1 years    Types: Cigars  . Smokeless tobacco: Never Used  . Alcohol Use: No    Allergies: No Known Allergies  Prescriptions prior to admission  Medication Sig Dispense Refill  . albuterol (PROVENTIL HFA;VENTOLIN HFA) 108 (90 BASE) MCG/ACT inhaler Inhale 2 puffs into the lungs every 6 (six)  hours as needed. For wheezing or shortness of breath  1 Inhaler  1  . lactobacillus acidophilus & bulgar (LACTINEX) chewable tablet Chew 1 tablet by mouth 3 (three) times daily with meals.  90 tablet  0  . omeprazole (PRILOSEC) 20 MG capsule Take 1 capsule (20 mg total) by mouth daily.  30 capsule  3  . Prenatal Vit-Fe Fumarate-FA (PRENATAL MULTIVITAMIN) TABS Take 1 tablet by mouth every morning.      . promethazine (PHENERGAN) 12.5 MG tablet Take 1 tablet (12.5 mg total) by mouth every 6 (six) hours as needed for nausea.  30 tablet  0    Review of Systems  Constitutional: Negative for fever and chills.  Gastrointestinal: Positive for nausea, vomiting, abdominal pain (RUQ pain) and diarrhea. Negative for constipation and blood in stool.  Genitourinary: Negative for dysuria, urgency and frequency.  Musculoskeletal: Negative for myalgias.  Neurological: Negative for dizziness.   Physical Exam   Blood pressure 121/70, pulse 106, temperature 98.6 F (37 C), temperature source Oral, resp. rate 18, height 5\' 4"  (  1.626 m), weight 76.93 kg (169 lb 9.6 oz), last menstrual period 05/24/2012.  Physical Exam  Nursing note and vitals reviewed. Constitutional: She is oriented to person, place, and time. She appears well-developed and well-nourished. No distress.  Cardiovascular: Normal rate.   Respiratory: Effort normal.  GI: Soft. She exhibits no distension. There is no tenderness. There is no rebound and no guarding.  Neurological: She is alert and oriented to person, place, and time.  Skin: Skin is warm and dry.  Psychiatric: She has a normal mood and affect.    MAU Course  Procedures  Results for orders placed during the hospital encounter of 11/26/12 (from the past 24 hour(s))  URINALYSIS, ROUTINE W REFLEX MICROSCOPIC     Status: Abnormal   Collection Time   11/26/12  1:30 PM      Component Value Range   Color, Urine YELLOW  YELLOW   APPearance HAZY (*) CLEAR   Specific Gravity, Urine  1.020  1.005 - 1.030   pH 6.5  5.0 - 8.0   Glucose, UA NEGATIVE  NEGATIVE mg/dL   Hgb urine dipstick NEGATIVE  NEGATIVE   Bilirubin Urine NEGATIVE  NEGATIVE   Ketones, ur 15 (*) NEGATIVE mg/dL   Protein, ur NEGATIVE  NEGATIVE mg/dL   Urobilinogen, UA 0.2  0.0 - 1.0 mg/dL   Nitrite NEGATIVE  NEGATIVE   Leukocytes, UA NEGATIVE  NEGATIVE  CBC     Status: Abnormal   Collection Time   11/26/12  1:46 PM      Component Value Range   WBC 10.4  4.0 - 10.5 K/uL   RBC 3.41 (*) 3.87 - 5.11 MIL/uL   Hemoglobin 10.6 (*) 12.0 - 15.0 g/dL   HCT 16.1 (*) 09.6 - 04.5 %   MCV 92.1  78.0 - 100.0 fL   MCH 31.1  26.0 - 34.0 pg   MCHC 33.8  30.0 - 36.0 g/dL   RDW 40.9  81.1 - 91.4 %   Platelets 216  150 - 400 K/uL  COMPREHENSIVE METABOLIC PANEL     Status: Abnormal   Collection Time   11/26/12  1:46 PM      Component Value Range   Sodium 133 (*) 135 - 145 mEq/L   Potassium 4.2  3.5 - 5.1 mEq/L   Chloride 100  96 - 112 mEq/L   CO2 24  19 - 32 mEq/L   Glucose, Bld 100 (*) 70 - 99 mg/dL   BUN 4 (*) 6 - 23 mg/dL   Creatinine, Ser 7.82 (*) 0.50 - 1.10 mg/dL   Calcium 9.3  8.4 - 95.6 mg/dL   Total Protein 6.7  6.0 - 8.3 g/dL   Albumin 3.0 (*) 3.5 - 5.2 g/dL   AST 24  0 - 37 U/L   ALT 16  0 - 35 U/L   Alkaline Phosphatase 45  39 - 117 U/L   Total Bilirubin 0.2 (*) 0.3 - 1.2 mg/dL   GFR calc non Af Amer >90  >90 mL/min   GFR calc Af Amer >90  >90 mL/min    1500: Spoke with Dr. Claiborne Billings. Reviewed patients status and labs. Pt is ok for DC home. FU with the office on Monday so that they can assist her in obtaining a GI consult. If she has any more episodes of blood in her emesis she should try to save it so we can see exactly how much blood there is.   Assessment and Plan   1. Bloody emesis  FU with primary OBGYN on Monday Emesis bags given for her to be able to collect and save any bloody emesis.  3rd trimester danger signs reviewed.    Tawnya Crook 11/26/2012, 2:02 PM

## 2012-11-30 NOTE — L&D Delivery Note (Signed)
Delivery Note At 2:52 AM a viable female, "Tiffany [name]" was delivered via  (Presentation: ROA;  ).  APGAR: , ; weight .   Placenta status:  Spontaneous, intact. , .  Cord:  with the following complications: None .  Cord pH: NA  Anesthesia: Epidural  Episiotomy: NA Lacerations: None Suture Repair: N/A Est. Blood Loss (mL): 200 cc  Small 1 cm area of swelling vs early hematoma noted on right vaginal wall just after delivery--pressure applied, with resolution of area. Ice pack applied to area.  No further evidence of hematoma.  Mom to postpartum.  Baby to skin to skin. Breastfeeding planned. 24 hour urine still in process--will complete at 5:40am.  Will d/c foley with completion.   Tiffany [name] 03/08/2013, 3:21 AM

## 2012-12-14 ENCOUNTER — Inpatient Hospital Stay (HOSPITAL_COMMUNITY)
Admission: AD | Admit: 2012-12-14 | Discharge: 2012-12-14 | Disposition: A | Discharge status: Home / Self Care | Payer: Medicaid Other | Source: Ambulatory Visit | Attending: Obstetrics and Gynecology | Admitting: Obstetrics and Gynecology

## 2012-12-14 ENCOUNTER — Encounter (HOSPITAL_COMMUNITY): Payer: Self-pay | Admitting: *Deleted

## 2012-12-14 DIAGNOSIS — W108XXA Fall (on) (from) other stairs and steps, initial encounter: Secondary | ICD-10-CM | POA: Insufficient documentation

## 2012-12-14 DIAGNOSIS — M5430 Sciatica, unspecified side: Secondary | ICD-10-CM

## 2012-12-14 DIAGNOSIS — M549 Dorsalgia, unspecified: Secondary | ICD-10-CM

## 2012-12-14 DIAGNOSIS — O99891 Other specified diseases and conditions complicating pregnancy: Secondary | ICD-10-CM | POA: Insufficient documentation

## 2012-12-14 MED ORDER — CYCLOBENZAPRINE HCL 5 MG PO TABS: 7.5000 mg | ORAL_TABLET | Freq: Once | ORAL | Status: DC

## 2012-12-14 MED ORDER — CYCLOBENZAPRINE HCL 10 MG PO TABS
10.0000 mg | ORAL_TABLET | Freq: Once | ORAL | Status: AC
  Administered 2012-12-14: 10 mg via ORAL
  Filled 2012-12-14: qty 1

## 2012-12-14 MED ORDER — IBUPROFEN 800 MG PO TABS
800.0000 mg | ORAL_TABLET | Freq: Once | ORAL | Status: AC
  Administered 2012-12-14: 800 mg via ORAL
  Filled 2012-12-14: qty 1

## 2012-12-14 MED ORDER — CYCLOBENZAPRINE HCL 10 MG PO TABS: 10.0000 mg | ORAL_TABLET | Freq: Three times a day (TID) | ORAL | Status: DC | PRN

## 2012-12-14 NOTE — MAU Note (Signed)
Going down the steps, had bedroom shoes on and slipped down 4 steps. (01/10) Did not get seen after fall.  Had to leave work because of the back pain.

## 2012-12-14 NOTE — MAU Provider Note (Signed)
History     CSN: [id number]  Arrival date and time: 12/14/12 1359   First Provider Initiated Contact with Patient 12/14/12 1439      Chief Complaint  Patient presents with  . Back Pain   HPI This is a 22 y.o. female at [redacted]w[redacted]d who presents with c/o lower back pain, over area of Right sciatic joint. Has had this pain off and on for a year or so, but it got worse after she slid down 4 steps 5 days ago. States no impact with belly. Slid along her lower back. No bleeding. Had to leave work for pain today. Works at [employer] and [employer].   RN Note: Going down the steps, had bedroom shoes on and slipped down 4 steps. (01/10) Did not get seen after fall. Had to leave work because of the back pain.      OB History    Grav Para Term Preterm Abortions TAB SAB Ect Mult Living   1 0 0 0 0 0 0 0 0 0       Past Medical History  Diagnosis Date  . Asthma   . Anemia     Past Surgical History  Procedure Date  . No past surgeries     Family History  Problem Relation Age of Onset  . Other Neg Hx   . Alcohol abuse Neg Hx   . Arthritis Neg Hx   . Asthma Neg Hx   . Birth defects Neg Hx   . Cancer Neg Hx   . COPD Neg Hx   . Depression Neg Hx   . Diabetes Neg Hx   . Drug abuse Neg Hx   . Early death Neg Hx   . Hearing loss Neg Hx   . Heart disease Neg Hx   . Hyperlipidemia Neg Hx   . Hypertension Neg Hx   . Kidney disease Neg Hx   . Learning disabilities Neg Hx   . Mental illness Neg Hx   . Mental retardation Neg Hx   . Miscarriages / Stillbirths Neg Hx   . Stroke Neg Hx   . Vision loss Neg Hx     History  Substance Use Topics  . Smoking status: Current Every Day Smoker -- 1 years    Types: Cigars  . Smokeless tobacco: Never Used  . Alcohol Use: No    Allergies: No Known Allergies  Prescriptions prior to admission  Medication Sig Dispense Refill  . omeprazole (PRILOSEC) 20 MG capsule Take 1 capsule (20 mg total) by mouth daily.  30 capsule  3  .  Prenatal Vit-Fe Fumarate-FA (PRENATAL MULTIVITAMIN) TABS Take 1 tablet by mouth every morning.      Marland Kitchen albuterol (PROVENTIL HFA;VENTOLIN HFA) 108 (90 BASE) MCG/ACT inhaler Inhale 2 puffs into the lungs every 6 (six) hours as needed. For wheezing or shortness of breath  1 Inhaler  1  . lactobacillus acidophilus & bulgar (LACTINEX) chewable tablet Chew 1 tablet by mouth 3 (three) times daily with meals.  90 tablet  0    Review of Systems  Constitutional: Negative for fever and chills.  Cardiovascular: Negative for chest pain.  Gastrointestinal: Negative for nausea, vomiting, abdominal pain, diarrhea and constipation.  Musculoskeletal: Positive for back pain.  Neurological: Negative for headaches.   Physical Exam   Blood pressure 123/70, pulse 104, temperature 98 F (36.7 C), temperature source Oral, resp. rate 18, height 5\' 4"  (1.626 m), weight 78.472 kg (173 lb), last menstrual period 05/24/2012, SpO2  100.00%.  Physical Exam  Constitutional: She is oriented to person, place, and time. She appears well-developed and well-nourished. No distress.  HENT:  Head: Normocephalic.  Cardiovascular: Normal rate.   Respiratory: Effort normal.  GI: Soft. She exhibits no distension and no mass. There is no tenderness. There is no rebound and no guarding.  Genitourinary: No vaginal discharge found.  Musculoskeletal: Normal range of motion. She exhibits tenderness.       Tender over Right Sciatic joint Good strength in bilateral legs Normal sensation  Neurological: She is alert and oriented to person, place, and time.  Skin: Skin is warm and dry.  Psychiatric: She has a normal mood and affect.    MAU Course  Procedures  MDM Given one dose of ibuprofen and flexeril per consult Dr Henderson Cloud.   Afterward states it mostly relieved her pain, which she now rates at "5"  Assessment and Plan  A:  SIUP at [redacted]w[redacted]d       Chronic Sciatica, exacerbated by fall   P:  Discharge        Rx Flexeril for home  use       Office to arrange Physical Therapy consult  [facility] 12/14/2012, 3:54 PM

## 2012-12-19 ENCOUNTER — Encounter (HOSPITAL_COMMUNITY): Payer: Self-pay | Admitting: *Deleted

## 2013-01-23 ENCOUNTER — Inpatient Hospital Stay (HOSPITAL_COMMUNITY)
Admission: AD | Admit: 2013-01-23 | Discharge: 2013-01-24 | Disposition: A | Discharge status: Home / Self Care | Payer: Medicaid Other | Source: Ambulatory Visit | Attending: Obstetrics & Gynecology | Admitting: Obstetrics & Gynecology

## 2013-01-23 DIAGNOSIS — N949 Unspecified condition associated with female genital organs and menstrual cycle: Secondary | ICD-10-CM | POA: Insufficient documentation

## 2013-01-23 DIAGNOSIS — O99891 Other specified diseases and conditions complicating pregnancy: Secondary | ICD-10-CM | POA: Insufficient documentation

## 2013-01-23 NOTE — MAU Note (Signed)
Pt states she has had pain in her low pelvis x 1.5 weeks-it is constant

## 2013-01-24 ENCOUNTER — Telehealth: Payer: Self-pay | Admitting: Obstetrics & Gynecology

## 2013-01-24 ENCOUNTER — Encounter (HOSPITAL_COMMUNITY): Payer: Self-pay | Admitting: *Deleted

## 2013-01-24 DIAGNOSIS — O479 False labor, unspecified: Secondary | ICD-10-CM

## 2013-01-24 LAB — URINALYSIS, ROUTINE W REFLEX MICROSCOPIC
Bilirubin Urine: NEGATIVE
Hgb urine dipstick: NEGATIVE
Specific Gravity, Urine: <1.005 — ABNORMAL LOW (ref 1.005–1.030)
Urobilinogen, UA: 0.2 mg/dL (ref 0.0–1.0)

## 2013-01-24 LAB — URINE MICROSCOPIC-ADD ON

## 2013-01-24 LAB — URINE CULTURE

## 2013-01-24 NOTE — MAU Note (Signed)
Patient describes right lower pelvic pain that has been going on for several days/weeks.  However, it has worsened.  She denies any bleeding, does feel good FM.

## 2013-01-24 NOTE — Telephone Encounter (Signed)
Called to make appointment, and to inform patient we need records from previous provider. Sister Tresa Endo answered contact phone, and stated she would relay message to sister to call us.

## 2013-01-24 NOTE — MAU Note (Signed)
Monitors D/C.  Pt up to bathroom. 

## 2013-01-24 NOTE — MAU Provider Note (Signed)
History     CSN: [id number]  Arrival date and time: 01/23/13 2238   First Provider Initiated Contact with Patient 01/24/13 0045      Chief Complaint  Patient presents with  . Pelvic Pain   HPI  Tiffany [name] is a 22 y.o. G1P0000 at [redacted]w[redacted]d who presents today with pubic bone pain x 1-2 weeks. She denies any UC. LOF, VB, +FM. She was a patient at [facility], but after her last appointment in January they told her she could no longer be seen there because her medicaid had lapsed. Her medicaid has since been reinstated. She would like to be seen in [facility] here.   Past Medical History  Diagnosis Date  . Asthma   . Anemia     Past Surgical History  Procedure Laterality Date  . No past surgeries      Family History  Problem Relation Age of Onset  . Other Neg Hx   . Alcohol abuse Neg Hx   . Arthritis Neg Hx   . Asthma Neg Hx   . Birth defects Neg Hx   . Cancer Neg Hx   . COPD Neg Hx   . Depression Neg Hx   . Diabetes Neg Hx   . Drug abuse Neg Hx   . Early death Neg Hx   . Hearing loss Neg Hx   . Heart disease Neg Hx   . Hyperlipidemia Neg Hx   . Hypertension Neg Hx   . Kidney disease Neg Hx   . Learning disabilities Neg Hx   . Mental illness Neg Hx   . Mental retardation Neg Hx   . Miscarriages / Stillbirths Neg Hx   . Stroke Neg Hx   . Vision loss Neg Hx     History  Substance Use Topics  . Smoking status: Current Every Day Smoker -- 1 years    Types: Cigars  . Smokeless tobacco: Never Used  . Alcohol Use: No    Allergies: No Known Allergies  Prescriptions prior to admission  Medication Sig Dispense Refill  . albuterol (PROVENTIL HFA;VENTOLIN HFA) 108 (90 BASE) MCG/ACT inhaler Inhale 2 puffs into the lungs every 6 (six) hours as needed. For wheezing or shortness of breath  1 Inhaler  1  . cyclobenzaprine (FLEXERIL) 10 MG tablet Take 1 tablet (10 mg total) by mouth 3 (three) times daily as needed for muscle spasms.  30 tablet  0  . lactobacillus  acidophilus & bulgar (LACTINEX) chewable tablet Chew 1 tablet by mouth 3 (three) times daily with meals.  90 tablet  0  . omeprazole (PRILOSEC) 20 MG capsule Take 1 capsule (20 mg total) by mouth daily.  30 capsule  3  . Prenatal Vit-Fe Fumarate-FA (PRENATAL MULTIVITAMIN) TABS Take 1 tablet by mouth every morning.        Review of Systems  Constitutional: Negative for fever.  Gastrointestinal: Negative for nausea, vomiting, abdominal pain, diarrhea and constipation.  Genitourinary: Negative for dysuria, urgency and frequency.  Neurological: Negative for dizziness and headaches.   Physical Exam   Blood pressure 126/85, pulse 93, temperature 98 F (36.7 C), temperature source Oral, resp. rate 20, height 5\' 4"  (1.626 m), weight 84.142 kg (185 lb 8 oz), last menstrual period 05/24/2012, SpO2 100.00%.  Physical Exam  Nursing note and vitals reviewed. Constitutional: She is oriented to person, place, and time. She appears well-developed and well-nourished. No distress.  Cardiovascular: Normal rate.   Respiratory: Effort normal.  GI: Soft. She  exhibits no distension. There is no tenderness.  Genitourinary:  Cervix: Closed/Thick/Firm/Posterior  FHT: 145, moderate with 15x15 accels. No decels Toco: irregular 20-30 second UC. Pt is not feeling them   Neurological: She is alert and oriented to person, place, and time.  Skin: Skin is warm and dry.  Psychiatric: She has a normal mood and affect.    MAU Course  Procedures  Results for orders placed during the hospital encounter of 01/23/13 (from the past 24 hour(s))  URINALYSIS, ROUTINE W REFLEX MICROSCOPIC     Status: Abnormal   Collection Time    01/24/13  1:00 AM      Result Value Range   Color, Urine YELLOW  YELLOW   APPearance HAZY (*) CLEAR   Specific Gravity, Urine <1.005 (*) 1.005 - 1.030   pH 6.5  5.0 - 8.0   Glucose, UA NEGATIVE  NEGATIVE mg/dL   Hgb urine dipstick NEGATIVE  NEGATIVE   Bilirubin Urine NEGATIVE  NEGATIVE    Ketones, ur NEGATIVE  NEGATIVE mg/dL   Protein, ur NEGATIVE  NEGATIVE mg/dL   Urobilinogen, UA 0.2  0.0 - 1.0 mg/dL   Nitrite NEGATIVE  NEGATIVE   Leukocytes, UA TRACE (*) NEGATIVE  URINE MICROSCOPIC-ADD ON     Status: Abnormal   Collection Time    01/24/13  1:00 AM      Result Value Range   Squamous Epithelial / LPF FEW (*) RARE   WBC, UA 3-6  <3 WBC/hpf   Bacteria, UA FEW (*) RARE    Assessment and Plan   1. Pelvic pressure in pregnancy, antepartum, third trimester    Third trimester discomforts discussed Comfort measures reviewed Will come to [facility] to continue [name]  Tawnya Crook 01/24/2013, 12:50 AM

## 2013-01-31 ENCOUNTER — Encounter: Payer: Self-pay | Admitting: Obstetrics and Gynecology

## 2013-01-31 ENCOUNTER — Ambulatory Visit: Payer: Medicaid Other | Admitting: Obstetrics and Gynecology

## 2013-01-31 NOTE — Progress Notes (Unsigned)
NOB interview. Pt transferring from [facility] with all records. PNV sample given. Labs complete including glucola. Sched for NOB W/u with CA 02/10/13. Pt advised to call with any concerns. Pt verbalizes comprehension. Reviewed transfer and hx with SL. Pt has hx recent MJ use. Was not informed of need to do drug screening. Per SL to be done at [facility].

## 2013-02-10 ENCOUNTER — Ambulatory Visit: Payer: Medicaid Other | Admitting: Obstetrics and Gynecology

## 2013-02-10 ENCOUNTER — Encounter: Payer: Self-pay | Admitting: Obstetrics and Gynecology

## 2013-02-10 ENCOUNTER — Encounter: Payer: Medicaid Other | Admitting: Certified Nurse Midwife

## 2013-02-10 VITALS — BP 124/70 | Wt 185.0 lb

## 2013-02-10 MED ORDER — CONCEPT DHA 53.5-38-1 MG PO CAPS: ORAL_CAPSULE | ORAL | Status: DC

## 2013-02-10 NOTE — Progress Notes (Signed)
[redacted]w[redacted]d. Due for GBS and GC/CT today. NOB transfer from [facility]. Pt will need to collect another urine for UDS today, per SL's last note.

## 2013-02-10 NOTE — Progress Notes (Signed)
[facility] NEW OB EXAMINATION   Tiffany [name] is a 22 y.o. female, G1P0000, who presents at [redacted]w[redacted]d gestation for a new obstetrical examination. Her due date is March 10, 2013.  The patient was followed in another practice earlier in the pregnancy.  Her pregnancy has been complicated by asthma, cigarette smoking, marijuana use, and noncompliance.  The following portions of the patient's history were reviewed and updated as appropriate: allergies, current medications, past family history, past medical history, past social history, past surgical history and problem list.  OB History   Grav Para Term Preterm Abortions TAB SAB Ect Mult Living   1 0 0 0 0 0 0 0 0 0       Past Medical History  Diagnosis Date  . Asthma   . Anemia   . GERD (gastroesophageal reflux disease)   . Shortness of breath     Past Surgical History  Procedure Laterality Date  . Wisdom tooth extraction  2009    Family History  Problem Relation Age of Onset  . Other Neg Hx   . Alcohol abuse Neg Hx   . Arthritis Neg Hx   . Birth defects Neg Hx   . Cancer Neg Hx   . COPD Neg Hx   . Depression Neg Hx   . Diabetes Neg Hx   . Drug abuse Neg Hx   . Early death Neg Hx   . Hearing loss Neg Hx   . Heart disease Neg Hx   . Hyperlipidemia Neg Hx   . Hypertension Neg Hx   . Kidney disease Neg Hx   . Learning disabilities Neg Hx   . Mental illness Neg Hx   . Mental retardation Neg Hx   . Miscarriages / Stillbirths Neg Hx   . Stroke Neg Hx   . Vision loss Neg Hx   . Asthma Sister   . Asthma Brother   . Asthma Other   . Asthma Other     Social History:  reports that she has been smoking Cigars.  She has never used smokeless tobacco. She reports that  drinks alcohol. She reports that she uses illicit drugs (Marijuana).  Allergies: No Known Allergies  Medications: prenatal vitamins, albuterol, Flexeril, Prilosec   Objective:    BP 124/70  Wt 185 lb (83.915 kg)  BMI 31.74 kg/m2  LMP 05/24/2012    Weight:  Wt Readings from Last 1 Encounters:  02/10/13 185 lb (83.915 kg)          BMI: Body mass index is 31.74 kg/(m^2).  General Appearance: Alert, appropriate appearance for age. No acute distress HEENT: Grossly normal Neck / Thyroid: Supple, no masses, nodes or enlargement Lungs: clear to auscultation bilaterally Back: No CVA tenderness Breast Exam: No masses or nodes.No dimpling, nipple retraction or discharge. Cardiovascular: Regular rate and rhythm. S1, S2, no murmur Gastrointestinal: Soft, non-tender, no masses or organomegaly.                               Fundal height: 36 weeks                               Fetal heart tones audible: yes  ++++++++++++++++++++++++++++++++++++++++++++++++++++++++  Pelvic Exam: External genitalia: normal general appearance Vaginal: normal without tenderness, induration or masses and relaxation: Yes Cervix: normal appearance Adnexa: normal bimanual exam Uterus: gravid, nontender, 36 weeks size  ++++++++++++++++++++++++++++++++++++++++++++++++++++++++  Lymphatic Exam: Non-palpable nodes in neck, clavicular, axillary, or inguinal regions Neurologic: Normal speech, no tremor  Psychiatric: Alert and oriented, appropriate affect.  Prenatal labs: ABO, Rh: --/--/A POS (08/06 1611) Antibody:  negative Rubella:  immune RPR:   nonreactive HBsAg:   negative HIV:   nonreactive GBS:   obtained today Sickle cell: Negative First trimester screening: Normal Alpha-fetoprotein: Normal Ultrasound: July 22, 2012 at [redacted] weeks gestation. Urine culture: Negative Glucola: 101  Wet Prep:   Previously done:            yes                      Assessment:   22 y.o. female G1P0000 at [redacted]w[redacted]d gestation ( EDC is March 10, 2013) by: Ultrasound:                               yes                                Cigarette smoker  Marijuana use  Asthma  Noncompliance   Plan:    Beta strep,GC, and Chlamydia sent.  Urine drug screen sent.  Smoking  cessation discussed.  Breast-feeding discussed.  Compliance discussed.  We discussed routine pregnancy issues:  Toxoplasmosis was reviewed.  The patient was told to avoid cat liter boxes and feces.  The patient was told to avoid predator fish including tuna because of our concerns for mercury consumption.  The patient was told to avoid soft cheeses.  The patient was told to be sure that all lunch meats are well cooked.  Our model for pregnancy management was reviewed.  Proper diet and exercise reviewed.  Return to office in 1 week.  Medications include:  Prenatal vitamins  Mylinda Latina.D.

## 2013-02-11 LAB — DRUG SCREEN, URINE
Amphetamine Screen, Ur: NEGATIVE
Benzodiazepines.: NEGATIVE
Cocaine Metabolites: NEGATIVE
Marijuana Metabolite: NEGATIVE
Phencyclidine (PCP): NEGATIVE

## 2013-02-17 ENCOUNTER — Encounter: Payer: Self-pay | Admitting: Family Medicine

## 2013-02-17 ENCOUNTER — Ambulatory Visit: Payer: Medicaid Other | Admitting: Family Medicine

## 2013-02-17 VITALS — BP 124/62 | Wt 188.0 lb

## 2013-02-17 DIAGNOSIS — Z331 Pregnant state, incidental: Secondary | ICD-10-CM

## 2013-02-17 DIAGNOSIS — O99322 Drug use complicating pregnancy, second trimester: Secondary | ICD-10-CM | POA: Insufficient documentation

## 2013-02-17 NOTE — Progress Notes (Signed)
[redacted]w[redacted]d No complaints today. Desires cervix check. Had GBS and GC/CT done at last OV.

## 2013-02-17 NOTE — Progress Notes (Signed)
[redacted]w[redacted]d Doing "okay, ready to have this baby".  Good fetal movement. GBS negative, CT/GC negative, UDS negative.  Pt requested a copy of results. Unsure about contraception, peds. ROB in 1 wk. L.Eyva Califano, FNP-BC

## 2013-03-05 ENCOUNTER — Inpatient Hospital Stay (HOSPITAL_COMMUNITY)
Admission: AD | Admit: 2013-03-05 | Discharge: 2013-03-05 | Disposition: A | Discharge status: Home / Self Care | Payer: Medicaid Other | Source: Ambulatory Visit | Attending: Obstetrics and Gynecology | Admitting: Obstetrics and Gynecology

## 2013-03-05 ENCOUNTER — Encounter (HOSPITAL_COMMUNITY): Payer: Self-pay

## 2013-03-05 DIAGNOSIS — O479 False labor, unspecified: Secondary | ICD-10-CM | POA: Insufficient documentation

## 2013-03-05 DIAGNOSIS — R109 Unspecified abdominal pain: Secondary | ICD-10-CM | POA: Insufficient documentation

## 2013-03-05 NOTE — MAU Provider Note (Signed)
History   CSN: [id number]  Arrival date and time: 03/05/13 0030   Chief Complaint  Patient presents with  . Abdominal Pain   HPI Pt presents to MAU with c/o of intermittent lower abd pain since earlier this evening.  Unsure of frequency.  Reports active fetus.  Denies ROM or bldg.  Unsure if pain is contractions. Denies any GI symptoms associated with pain.  Denies any UTI s/s.  Has not treated her pain.      OB History   Grav Para Term Preterm Abortions TAB SAB Ect Mult Living   1 0 0 0 0 0 0 0 0 0       Past Medical History  Diagnosis Date  . Asthma   . Anemia   . GERD (gastroesophageal reflux disease)   . Shortness of breath     Past Surgical History  Procedure Laterality Date  . Wisdom tooth extraction  2009    Family History  Problem Relation Age of Onset  . Other Neg Hx   . Alcohol abuse Neg Hx   . Arthritis Neg Hx   . Birth defects Neg Hx   . Cancer Neg Hx   . COPD Neg Hx   . Depression Neg Hx   . Diabetes Neg Hx   . Drug abuse Neg Hx   . Early death Neg Hx   . Hearing loss Neg Hx   . Heart disease Neg Hx   . Hyperlipidemia Neg Hx   . Hypertension Neg Hx   . Kidney disease Neg Hx   . Learning disabilities Neg Hx   . Mental illness Neg Hx   . Mental retardation Neg Hx   . Miscarriages / Stillbirths Neg Hx   . Stroke Neg Hx   . Vision loss Neg Hx   . Asthma Sister   . Asthma Brother   . Asthma Other   . Asthma Other     History  Substance Use Topics  . Smoking status: Current Every Day Smoker -- 0.10 packs/day for 1 years    Types: Cigars  . Smokeless tobacco: Never Used  . Alcohol Use: Yes     Comment: RARELY    Allergies: No Known Allergies  Prescriptions prior to admission  Medication Sig Dispense Refill  . albuterol (PROVENTIL HFA;VENTOLIN HFA) 108 (90 BASE) MCG/ACT inhaler Inhale 2 puffs into the lungs every 6 (six) hours as needed. For wheezing or shortness of breath  1 Inhaler  1  . cyclobenzaprine (FLEXERIL) 10 MG tablet Take 1  tablet (10 mg total) by mouth 3 (three) times daily as needed for muscle spasms.  30 tablet  0  . lactobacillus acidophilus & bulgar (LACTINEX) chewable tablet Chew 1 tablet by mouth 3 (three) times daily with meals.  90 tablet  0  . omeprazole (PRILOSEC) 20 MG capsule Take 1 capsule (20 mg total) by mouth daily.  30 capsule  3  . Prenat-FeFum-FePo-FA-Omega 3 (CONCEPT DHA) 53.5-38-1 MG CAPS Take one capsule daily # 30 w/ 11 RF's.  30 capsule  11  . Prenatal Vit-Fe Fumarate-FA (PRENATAL MULTIVITAMIN) TABS Take 1 tablet by mouth every morning.        Review of Systems  Constitutional: Negative.   HENT: Negative.   Eyes: Negative.   Respiratory: Negative.   Gastrointestinal: Negative.   Genitourinary: Negative.   Musculoskeletal: Negative.   Skin: Negative.   Neurological: Negative.   Endo/Heme/Allergies: Negative.   Psychiatric/Behavioral: Negative.    Physical Exam   Blood pressure  142/89, pulse 109, temperature 98.8 F (37.1 C), temperature source Oral, resp. rate 16, last menstrual period 05/24/2012. Repeat blood pressure 128/80 prior to discharge.  Physical Exam  Constitutional: She is oriented to person, place, and time. She appears well-developed and well-nourished.  HENT:  Head: Normocephalic and atraumatic.  Right Ear: External ear normal.  Left Ear: External ear normal.  Eyes: Conjunctivae are normal. Pupils are equal, round, and reactive to light.  Neck: Normal range of motion. Neck supple.  Cardiovascular: Normal rate, regular rhythm and intact distal pulses.   Respiratory: Effort normal and breath sounds normal.  GI: Soft. Bowel sounds are normal. She exhibits no distension. There is no tenderness. There is no rebound and no guarding.  Genitourinary: Vagina normal and uterus normal.  Musculoskeletal: Normal range of motion.  Neurological: She is alert and oriented to person, place, and time. She has normal reflexes.  Skin: Skin is warm and dry.  Psychiatric: She has  a normal mood and affect. Her behavior is normal. Thought content normal.   SVE 1cm/60%/-3 without change after 1hr of observation.   FHR baseline 145 bpm; variability moderate; accels present; decels absent. Occas UC on toco, approx 12-14 mins apart.  Pt not aware of UC on monitor.  Palpate mild.   MAU Course  Procedures   Assessment and Plan  IUP at 37w 5d False labor  Discharged to home. F/U as scheduled on Wed 03/08/13 at [facility]. Rev s/s labor and fetal kick counts.    Emmett Bracknell O. 03/05/2013, 1:31 AM

## 2013-03-05 NOTE — MAU Note (Signed)
Onset of pain and pressure since 2200.

## 2013-03-07 ENCOUNTER — Encounter (HOSPITAL_COMMUNITY): Payer: Self-pay | Admitting: Anesthesiology

## 2013-03-07 ENCOUNTER — Encounter (HOSPITAL_COMMUNITY): Payer: Self-pay

## 2013-03-07 ENCOUNTER — Inpatient Hospital Stay (HOSPITAL_COMMUNITY)
Admission: AD | Admit: 2013-03-07 | Discharge: 2013-03-10 | DRG: 774 | Disposition: A | Discharge status: Home / Self Care | Payer: Medicaid Other | Source: Ambulatory Visit | Attending: Obstetrics and Gynecology | Admitting: Obstetrics and Gynecology

## 2013-03-07 ENCOUNTER — Inpatient Hospital Stay (HOSPITAL_COMMUNITY): Payer: Medicaid Other | Admitting: Anesthesiology

## 2013-03-07 DIAGNOSIS — O1404 Mild to moderate pre-eclampsia, complicating childbirth: Secondary | ICD-10-CM | POA: Diagnosis present

## 2013-03-07 DIAGNOSIS — F17210 Nicotine dependence, cigarettes, uncomplicated: Secondary | ICD-10-CM | POA: Diagnosis present

## 2013-03-07 DIAGNOSIS — IMO0001 Reserved for inherently not codable concepts without codable children: Secondary | ICD-10-CM

## 2013-03-07 DIAGNOSIS — O99322 Drug use complicating pregnancy, second trimester: Secondary | ICD-10-CM | POA: Diagnosis present

## 2013-03-07 DIAGNOSIS — O9903 Anemia complicating the puerperium: Secondary | ICD-10-CM | POA: Diagnosis not present

## 2013-03-07 DIAGNOSIS — IMO0002 Reserved for concepts with insufficient information to code with codable children: Principal | ICD-10-CM | POA: Diagnosis present

## 2013-03-07 DIAGNOSIS — D649 Anemia, unspecified: Secondary | ICD-10-CM | POA: Diagnosis not present

## 2013-03-07 LAB — RAPID URINE DRUG SCREEN, HOSP PERFORMED
Barbiturates: NOT DETECTED
Benzodiazepines: NOT DETECTED
Cocaine: NOT DETECTED
Opiates: NOT DETECTED

## 2013-03-07 LAB — URINALYSIS, DIPSTICK ONLY
Glucose, UA: NEGATIVE mg/dL
Ketones, ur: NEGATIVE mg/dL
Nitrite: NEGATIVE
Protein, ur: NEGATIVE mg/dL
pH: 6 (ref 5.0–8.0)

## 2013-03-07 LAB — COMPREHENSIVE METABOLIC PANEL
Albumin: 2.7 g/dL — ABNORMAL LOW (ref 3.5–5.2)
Alkaline Phosphatase: 123 U/L — ABNORMAL HIGH (ref 39–117)
BUN: 6 mg/dL (ref 6–23)
Calcium: 9.1 mg/dL (ref 8.4–10.5)
GFR calc Af Amer: >90 mL/min (ref >90)
Glucose, Bld: 99 mg/dL (ref 70–99)
Potassium: 4.1 mEq/L (ref 3.5–5.1)
Total Protein: 6.4 g/dL (ref 6.0–8.3)

## 2013-03-07 LAB — URIC ACID: Uric Acid, Serum: 6 mg/dL (ref 2.4–7.0)

## 2013-03-07 LAB — CBC
HCT: 30.5 % — ABNORMAL LOW (ref 36.0–46.0)
Hemoglobin: 10.1 g/dL — ABNORMAL LOW (ref 12.0–15.0)
RDW: 14.6 % (ref 11.5–15.5)
WBC: 11.8 10*3/uL — ABNORMAL HIGH (ref 4.0–10.5)

## 2013-03-07 LAB — TYPE AND SCREEN
ABO/RH(D): A POS
Antibody Screen: NEGATIVE

## 2013-03-07 LAB — LACTATE DEHYDROGENASE: LDH: 239 U/L (ref 94–250)

## 2013-03-07 MED ORDER — CITRIC ACID-SODIUM CITRATE 334-500 MG/5ML PO SOLN: 30.0000 mL | ORAL | Status: DC | PRN

## 2013-03-07 MED ORDER — PHENYLEPHRINE 40 MCG/ML (10ML) SYRINGE FOR IV PUSH (FOR BLOOD PRESSURE SUPPORT)
80.0000 ug | PREFILLED_SYRINGE | INTRAVENOUS | Status: DC | PRN
Start: 2013-03-07 — End: 2013-03-08
  Filled 2013-03-07: qty 2
  Filled 2013-03-07: qty 5

## 2013-03-07 MED ORDER — EPHEDRINE 5 MG/ML INJ
10.0000 mg | INTRAVENOUS | Status: DC | PRN
  Filled 2013-03-07: qty 2
  Filled 2013-03-07: qty 4

## 2013-03-07 MED ORDER — LACTATED RINGERS IV SOLN: 500.0000 mL | INTRAVENOUS | Status: DC | PRN

## 2013-03-07 MED ORDER — LACTATED RINGERS IV SOLN
500.0000 mL | Freq: Once | INTRAVENOUS | Status: AC
Start: 2013-03-07 — End: 2013-03-07
  Administered 2013-03-07: 500 mL via INTRAVENOUS

## 2013-03-07 MED ORDER — EPHEDRINE 5 MG/ML INJ
10.0000 mg | INTRAVENOUS | Status: DC | PRN
  Filled 2013-03-07: qty 2

## 2013-03-07 MED ORDER — OXYTOCIN 40 UNITS IN LACTATED RINGERS INFUSION - SIMPLE MED
62.5000 mL/h | INTRAVENOUS | Status: DC
  Administered 2013-03-08: 62.5 mL/h via INTRAVENOUS

## 2013-03-07 MED ORDER — DIPHENHYDRAMINE HCL 50 MG/ML IJ SOLN: 12.5000 mg | INTRAMUSCULAR | Status: DC | PRN

## 2013-03-07 MED ORDER — ONDANSETRON HCL 4 MG/2ML IJ SOLN: 4.0000 mg | Freq: Four times a day (QID) | INTRAMUSCULAR | Status: DC | PRN

## 2013-03-07 MED ORDER — OXYTOCIN 40 UNITS IN LACTATED RINGERS INFUSION - SIMPLE MED
1.0000 m[IU]/min | INTRAVENOUS | Status: DC
  Administered 2013-03-07: 1 m[IU]/min via INTRAVENOUS
  Administered 2013-03-08: 7 m[IU]/min via INTRAVENOUS
  Filled 2013-03-07: qty 1000

## 2013-03-07 MED ORDER — PHENYLEPHRINE 40 MCG/ML (10ML) SYRINGE FOR IV PUSH (FOR BLOOD PRESSURE SUPPORT)
80.0000 ug | PREFILLED_SYRINGE | INTRAVENOUS | Status: DC | PRN
  Filled 2013-03-07: qty 2

## 2013-03-07 MED ORDER — LIDOCAINE HCL (PF) 1 % IJ SOLN
INTRAMUSCULAR | Status: DC | PRN
  Administered 2013-03-07 (×4): 4 mL

## 2013-03-07 MED ORDER — ACETAMINOPHEN 325 MG PO TABS: 650.0000 mg | ORAL_TABLET | ORAL | Status: DC | PRN

## 2013-03-07 MED ORDER — OXYTOCIN BOLUS FROM INFUSION: 500.0000 mL | INTRAVENOUS | Status: DC

## 2013-03-07 MED ORDER — FENTANYL 2.5 MCG/ML BUPIVACAINE 1/10 % EPIDURAL INFUSION (WH - ANES)
14.0000 mL/h | INTRAMUSCULAR | Status: DC | PRN
  Administered 2013-03-07 – 2013-03-08 (×4): 14 mL/h via EPIDURAL
  Filled 2013-03-07 (×5): qty 125

## 2013-03-07 MED ORDER — FENTANYL CITRATE 0.05 MG/ML IJ SOLN: 100.0000 ug | INTRAMUSCULAR | Status: DC | PRN

## 2013-03-07 MED ORDER — LIDOCAINE HCL (PF) 1 % IJ SOLN
30.0000 mL | INTRAMUSCULAR | Status: DC | PRN
  Administered 2013-03-08: 30 mL via SUBCUTANEOUS
  Filled 2013-03-07 (×2): qty 30

## 2013-03-07 MED ORDER — OXYCODONE-ACETAMINOPHEN 5-325 MG PO TABS: 1.0000 | ORAL_TABLET | ORAL | Status: DC | PRN

## 2013-03-07 MED ORDER — IBUPROFEN 600 MG PO TABS
600.0000 mg | ORAL_TABLET | Freq: Four times a day (QID) | ORAL | Status: DC | PRN
  Administered 2013-03-08: 600 mg via ORAL
  Filled 2013-03-07: qty 1

## 2013-03-07 MED ORDER — TERBUTALINE SULFATE 1 MG/ML IJ SOLN: 0.2500 mg | Freq: Once | INTRAMUSCULAR | Status: AC | PRN

## 2013-03-07 MED ORDER — LACTATED RINGERS IV SOLN
INTRAVENOUS | Status: DC
  Administered 2013-03-07 (×2): via INTRAUTERINE

## 2013-03-07 MED ORDER — LACTATED RINGERS IV SOLN
INTRAVENOUS | Status: DC
  Administered 2013-03-07 (×2): via INTRAVENOUS

## 2013-03-07 NOTE — Anesthesia Procedure Notes (Signed)
Epidural Patient location during procedure: OB Start time: 03/07/2013 5:10 AM  Staffing Performed by: anesthesiologist   Preanesthetic Checklist Completed: patient identified, site marked, surgical consent, pre-op evaluation, timeout performed, IV checked, risks and benefits discussed and monitors and equipment checked  Epidural Patient position: sitting Prep: site prepped and draped and DuraPrep Patient monitoring: continuous pulse ox and blood pressure Approach: midline Injection technique: LOR air  Needle:  Needle type: Tuohy  Needle gauge: 17 G Needle length: 9 cm and 9 Needle insertion depth: 7 cm Catheter type: closed end flexible Catheter size: 19 Gauge Catheter at skin depth: 12 cm Test dose: negative  Assessment Events: blood not aspirated, injection not painful, no injection resistance, negative IV test and no paresthesia  Additional Notes Discussed risk of headache, infection, bleeding, nerve injury and failed or incomplete block.  Patient voices understanding and wishes to proceed.  Epidural placed easily on first attempt.  No paresthesia.  Patient tolerated procedure well with no apparent complications.  Jasmine December, MD Reason for block:procedure for pain

## 2013-03-07 NOTE — Progress Notes (Signed)
  Subjective: Feeling pressure  Objective: BP 95/55  Pulse 106  Temp(Src) 98 F (36.7 C) (Oral)  Resp 22  Ht 5\' 5"  (1.651 m)  Wt 199 lb (90.266 kg)  BMI 33.12 kg/m2  SpO2 100%  LMP 05/24/2012 I/O last 3 completed shifts: In: 1633.3 [P.O.:100; I.V.:883.3; Other:650] Out: -     FHT:  Category 2, with moderate variability, decels with contractions, but resolve with position change. UC:   regular, every 2 minutes Pitocin on 11 mu/min SVE:   Dilation: Lip/rim Effacement (%): 90 Station: -1 Exam by:: [employer] FSE placed. No real descent with pushing attempt  Assessment / Plan: Progressive labor Allow to labor down if FHR remains reassuring. Start amnioinfusion prn  Nigel Bridgeman 03/07/2013, 10:35 PM

## 2013-03-07 NOTE — MAU Note (Signed)
PT PRESENTS SAYING SROM AT 0246- CLEAR FLUID- POSTIVE FERN.   DENIES HSV, MRSA, BLEEDING.  SAYS WAS 2 CM ON SAT. GBS- NEG.  FHR- 145.

## 2013-03-07 NOTE — Progress Notes (Signed)
  Subjective: Comfortable with epidural.  Had to awaken patient for exam.   Objective: BP 131/73  Pulse 115  Temp(Src) 97.9 F (36.6 C) (Oral)  Resp 22  Ht 5\' 5"  (1.651 m)  Wt 199 lb (90.266 kg)  BMI 33.12 kg/m2  SpO2 100%  LMP 05/24/2012 I/O last 3 completed shifts: In: 1633.3 [P.O.:100; I.V.:883.3; Other:650] Out: -     FHT:  Category 1--had single decel x 2 min with exam, but recovered quickly with position change to reactive tracing. UC:   regular, every 2-3 minutes SVE:   6 cm, 100%, vtx, -1 IUPC placed without difficulty. Pitocin on 10 mu/min  Assessment / Plan: Progressive labor Will CTO.  Nigel Bridgeman 03/07/2013, 9:09 PM

## 2013-03-07 NOTE — Plan of Care (Signed)
Problem: Consults Goal: Birthing Suites Patient Information Press F2 to bring up selections list  Outcome: Completed/Met Date Met:  03/07/13  Pt 37-[redacted] weeks EGA

## 2013-03-07 NOTE — Anesthesia Preprocedure Evaluation (Signed)
Anesthesia Evaluation  Patient identified by MRN, date of birth, ID band Patient awake    Reviewed: Allergy & Precautions, H&P , NPO status , Patient's Chart, lab work & pertinent test results, reviewed documented beta blocker date and time   History of Anesthesia Complications Negative for: history of anesthetic complications  Airway Mallampati: III TM Distance: >3 FB Neck ROM: full    Dental  (+) Teeth Intact   Pulmonary neg pulmonary ROS, asthma , Current Smoker,  breath sounds clear to auscultation        Cardiovascular negative cardio ROS  Rhythm:regular Rate:Normal     Neuro/Psych negative neurological ROS  negative psych ROS   GI/Hepatic GERD-  Medicated,(+)       marijuana use,   Endo/Other  negative endocrine ROS  Renal/GU negative Renal ROS  negative genitourinary   Musculoskeletal   Abdominal   Peds  Hematology  (+) anemia ,   Anesthesia Other Findings Tongue piercing - asked to remove  Reproductive/Obstetrics (+) Pregnancy                           Anesthesia Physical Anesthesia Plan  ASA: II  Anesthesia Plan: Epidural   Post-op Pain Management:    Induction:   Airway Management Planned:   Additional Equipment:   Intra-op Plan:   Post-operative Plan:   Informed Consent: I have reviewed the patients History and Physical, chart, labs and discussed the procedure including the risks, benefits and alternatives for the proposed anesthesia with the patient or authorized representative who has indicated his/her understanding and acceptance.     Plan Discussed with:   Anesthesia Plan Comments:         Anesthesia Quick Evaluation

## 2013-03-07 NOTE — Progress Notes (Signed)
  Subjective: Sleeping soundly. Comfortable with epidural.  Family at bedside.  Objective: BP 135/76  Pulse 101  Temp(Src) 97.9 F (36.6 C) (Oral)  Resp 22  Ht 5\' 5"  (1.651 m)  Wt 199 lb (90.266 kg)  BMI 33.12 kg/m2  SpO2 100%  LMP 05/24/2012 I/O last 3 completed shifts: In: 1633.3 [P.O.:100; I.V.:883.3; Other:650] Out: -    Filed Vitals:   03/07/13 1801 03/07/13 1831 03/07/13 1902 03/07/13 1931  BP: 125/68 132/68 132/73 135/76  Pulse: 99 89 92 101  Temp: 97.9 F (36.6 C)     TempSrc: Oral     Resp: 20  22   Height:      Weight:      SpO2:       Urine slightly pink 24 hour urine in process due to elevated BP on admission--now normotensive.  FHT:  Category 1 UC:   regular, every 2-3 minutes, occasional coupling Last exam 6:45pm by [name], with cervix 4, 80%, vtx, -1  Assessment / Plan: Progressive labor GBS negative UDS negative  Plan: Recheck cervix around 9p--IUPC prn. Continue pitocin augmentation. UA sent from cath for protein/blood assessment.    Nigel Bridgeman 03/07/2013, 8:04 PM

## 2013-03-08 ENCOUNTER — Encounter (HOSPITAL_COMMUNITY): Payer: Self-pay | Admitting: *Deleted

## 2013-03-08 LAB — CREATININE CLEARANCE, URINE, 24 HOUR
Collection Interval-CRCL: 24 hours
Creatinine, 24H Ur: 1790 mg/d (ref 700–1800)
Urine Total Volume-CRCL: 3850 mL

## 2013-03-08 LAB — PROTEIN, URINE, 24 HOUR
Collection Interval-UPROT: 24 hours
Protein, 24H Urine: 424 mg/d — ABNORMAL HIGH (ref 50–100)
Protein, Urine: 11 mg/dL

## 2013-03-08 MED ORDER — ZOLPIDEM TARTRATE 5 MG PO TABS: 5.0000 mg | ORAL_TABLET | Freq: Every evening | ORAL | Status: DC | PRN

## 2013-03-08 MED ORDER — ONDANSETRON HCL 4 MG PO TABS: 4.0000 mg | ORAL_TABLET | ORAL | Status: DC | PRN

## 2013-03-08 MED ORDER — DIPHENHYDRAMINE HCL 25 MG PO CAPS: 25.0000 mg | ORAL_CAPSULE | Freq: Four times a day (QID) | ORAL | Status: DC | PRN

## 2013-03-08 MED ORDER — BENZOCAINE-MENTHOL 20-0.5 % EX AERO: 1.0000 "application " | INHALATION_SPRAY | CUTANEOUS | Status: DC | PRN

## 2013-03-08 MED ORDER — WITCH HAZEL-GLYCERIN EX PADS: 1.0000 "application " | MEDICATED_PAD | CUTANEOUS | Status: DC | PRN

## 2013-03-08 MED ORDER — PNEUMOCOCCAL VAC POLYVALENT 25 MCG/0.5ML IJ INJ
0.5000 mL | INJECTION | INTRAMUSCULAR | Status: AC
  Administered 2013-03-08: 0.5 mL via INTRAMUSCULAR
  Filled 2013-03-08: qty 0.5

## 2013-03-08 MED ORDER — PRENATAL MULTIVITAMIN CH
1.0000 | ORAL_TABLET | Freq: Every day | ORAL | Status: DC
  Administered 2013-03-08 – 2013-03-09 (×2): 1 via ORAL
  Filled 2013-03-08 (×2): qty 1

## 2013-03-08 MED ORDER — HEPATITIS B VAC RECOMBINANT 10 MCG/0.5ML IJ SUSP
0.5000 mL | Freq: Once | INTRAMUSCULAR | Status: DC
  Filled 2013-03-08: qty 0.5

## 2013-03-08 MED ORDER — TETANUS-DIPHTH-ACELL PERTUSSIS 5-2.5-18.5 LF-MCG/0.5 IM SUSP
0.5000 mL | Freq: Once | INTRAMUSCULAR | Status: AC
  Administered 2013-03-08: 0.5 mL via INTRAMUSCULAR

## 2013-03-08 MED ORDER — IBUPROFEN 600 MG PO TABS
600.0000 mg | ORAL_TABLET | Freq: Four times a day (QID) | ORAL | Status: DC
  Administered 2013-03-08 – 2013-03-10 (×8): 600 mg via ORAL
  Filled 2013-03-08 (×7): qty 1

## 2013-03-08 MED ORDER — OXYCODONE-ACETAMINOPHEN 5-325 MG PO TABS
1.0000 | ORAL_TABLET | ORAL | Status: DC | PRN
  Administered 2013-03-08 – 2013-03-09 (×2): 1 via ORAL
  Administered 2013-03-09: 2 via ORAL
  Filled 2013-03-08 (×2): qty 1
  Filled 2013-03-08: qty 2

## 2013-03-08 MED ORDER — SENNOSIDES-DOCUSATE SODIUM 8.6-50 MG PO TABS
2.0000 | ORAL_TABLET | Freq: Every day | ORAL | Status: DC
  Administered 2013-03-08 – 2013-03-09 (×2): 2 via ORAL

## 2013-03-08 MED ORDER — ONDANSETRON HCL 4 MG/2ML IJ SOLN: 4.0000 mg | INTRAMUSCULAR | Status: DC | PRN

## 2013-03-08 MED ORDER — VITAMIN K1 1 MG/0.5ML IJ SOLN
1.0000 mg | Freq: Once | INTRAMUSCULAR | Status: DC
  Filled 2013-03-08: qty 0.5

## 2013-03-08 MED ORDER — ERYTHROMYCIN 5 MG/GM OP OINT: 1.0000 "application " | TOPICAL_OINTMENT | Freq: Once | OPHTHALMIC | Status: DC

## 2013-03-08 MED ORDER — SUCROSE 24% NICU/PEDS ORAL SOLUTION
0.5000 mL | OROMUCOSAL | Status: DC | PRN
  Filled 2013-03-08: qty 0.5

## 2013-03-08 MED ORDER — SIMETHICONE 80 MG PO CHEW: 80.0000 mg | CHEWABLE_TABLET | ORAL | Status: DC | PRN

## 2013-03-08 MED ORDER — LANOLIN HYDROUS EX OINT: TOPICAL_OINTMENT | CUTANEOUS | Status: DC | PRN

## 2013-03-08 MED ORDER — DIBUCAINE 1 % RE OINT: 1.0000 "application " | TOPICAL_OINTMENT | RECTAL | Status: DC | PRN

## 2013-03-08 NOTE — Anesthesia Postprocedure Evaluation (Signed)
  Anesthesia Post-op Note  Patient: Tiffany [name]  Procedure(s) Performed: * No procedures listed *  Patient Location: PACU and Mother/Baby  Anesthesia Type:Epidural  Level of Consciousness: awake, alert , oriented and patient cooperative  Airway and Oxygen Therapy: Patient Spontanous Breathing  Post-op Pain: none  Post-op Assessment: Post-op Vital signs reviewed, Patient's Cardiovascular Status Stable and Respiratory Function Stable  Post-op Vital Signs: Reviewed and stable  Complications: No apparent anesthesia complications

## 2013-03-08 NOTE — Progress Notes (Signed)
UR chart review completed.  

## 2013-03-08 NOTE — H&P (Signed)
Tiffany [name] is a 22 y.o. female, G1P0 at 65 4/7 weeks, presenting for SROM at 2:46aam, clear fluid.  Denies bleeding, HA, visual sx, or epigastric pain.  Problem List: Late transfer to [facility], with ? Intermittent prenatal care at [facility] prior to transfer Smoker +UDS for marijuana at [facility] visit Asthma--stable GERD  History of present pregnancy: Patient entered care at [facility] at 34 weeks for interview, and 36 weeks for 1st visit.  EDC of 03/10/13 was established by [place] at 7 weeks.   Anatomy scan:  18-19 weeks, with normal findings.  Additional [place] evaluations:  None  Significant prenatal events:  +UDS at [facility] for marijuana Last evaluation:  02/28/13 at [facility].  History OB History   Grav Para Term Preterm Abortions TAB SAB Ect Mult Living   1 0 0 0 0 0 0 0 0 0      Past Medical History  Diagnosis Date  . Asthma   . Anemia   . GERD (gastroesophageal reflux disease)   . Shortness of breath    Past Surgical History  Procedure Laterality Date  . Wisdom tooth extraction  2009   Family History: family history includes Asthma in her brother, others, and sister.  There is no history of Other, and Alcohol abuse, and Arthritis, and Birth defects, and Cancer, and COPD, and Depression, and Diabetes, and Drug abuse, and Early death, and Hearing loss, and Heart disease, and Hyperlipidemia, and Hypertension, and Kidney disease, and Learning disabilities, and Mental illness, and Mental retardation, and Miscarriages / Stillbirths, and Stroke, and Vision loss, .  Social History:  reports that she has been smoking Cigars.  She has never used smokeless tobacco. She reports that  drinks alcohol. She reports that she uses illicit drugs (Marijuana).  FOB is not involved.  Patient is not employed.   Prenatal Transfer Tool  Maternal Diabetes: No Genetic Screening: Normal Maternal Ultrasounds/Referrals: Normal Fetal Ultrasounds or other Referrals:  None Maternal Substance Abuse:  Yes:  Type: Smoker,  Marijuana during pregnancy--+ UDS at 36 weeks, negative on admission 03/07/13. Significant Maternal Medications:  None Significant Maternal Lab Results:  Lab values include: Group B Strep negative.  Other Comments:  Late transfer to [facility] from [facility], with compliance with [facility] prior to transfer.  ROS:  Leaking clear fluid, + mild UCs, +FM  Initial VS:  BP 140s-150s/80s-95 Pulse 110s, other VS WNL. Afebrile  Exam Physical Exam  Chest clear Heart RRR without murmur Abd gravid, NT Pevic--2 cm, 70%, vtx, -2 by Sanda Klein, CNM Ext DTR 2+ without clonus, trace edema  FHR Category 1 UCs 4-7 minutes, moderate  Prenatal labs: ABO, Rh: --/--/A POS (04/08 0355) Antibody: NEG (04/08 0355) Rubella: Immune (10/07 0000) RPR: NON REACTIVE (04/08 0355)  HBsAg: Negative (10/07 0000)  HIV: Non-reactive (10/07 0000)  GBS: Negative (03/14 0000)  Sickle cell negative per patient report. Glucola WNL UDS + for marijuana at [facility] visit at 36 weeks   Assessment/Plan: IUP at 39 4/7 weeks Early labor, with SROM GBS negative Hx +UDS Mild elevation of BP  Plan: Admit to [facility] per consult with Dr. Pennie Rushing Routine [facility] orders Plan epidural placement per patient request  Augment prn. Check UDS. PIH labs, with 24 hour urine initiated after foley placed  Nigel Bridgeman for [name] (patient admitted 03/07/13) 03/08/2013, 4:52 AM

## 2013-03-08 NOTE — Progress Notes (Signed)
  Subjective: Sleeping soundly.  Objective: BP 93/45  Pulse 97  Temp(Src) 97.3 F (36.3 C) (Oral)  Resp 22  Ht 5\' 5"  (1.651 m)  Wt 199 lb (90.266 kg)  BMI 33.12 kg/m2  SpO2 100%  LMP 05/24/2012 I/O last 3 completed shifts: In: 1633.3 [P.O.:100; I.V.:883.3; Other:650] Out: -    Filed Vitals:   03/07/13 2231 03/07/13 2301 03/07/13 2331 03/08/13 0003  BP: 95/55 123/68 129/68 93/45  Pulse: 106 102 101 97  Temp:    97.3 F (36.3 C)  TempSrc:    Oral  Resp:      Height:      Weight:      SpO2:       Urine negative for protein on random check. Urine clear, slightly concentrated.  FHT:  Moderate variability, series of variables with UCs, then cycle of late decels with patient on right side.  Resolved with position change and O2 UC:   regular, every 2-3 minutes SVE:   Dilation: Lip/rim Effacement (%): 100 Station: -1 Exam by:: lathem,cnm Rim now just on right side, very thin.  Assessment / Plan: Slowly progressive dilation ? Asynclitic presentation Will CTO at present. Change position to facilitate rotation and descent. Re-evaluate in 1-2 hours or prn based on FHR status.  Nigel Bridgeman 03/08/2013, 12:17 AM

## 2013-03-09 DIAGNOSIS — O1404 Mild to moderate pre-eclampsia, complicating childbirth: Secondary | ICD-10-CM | POA: Diagnosis present

## 2013-03-09 LAB — CBC
MCHC: 33.3 g/dL (ref 30.0–36.0)
Platelets: 228 10*3/uL (ref 150–400)
RDW: 14.7 % (ref 11.5–15.5)
WBC: 14.4 10*3/uL — ABNORMAL HIGH (ref 4.0–10.5)

## 2013-03-09 LAB — COMPREHENSIVE METABOLIC PANEL
Albumin: 2.4 g/dL — ABNORMAL LOW (ref 3.5–5.2)
Alkaline Phosphatase: 102 U/L (ref 39–117)
BUN: 5 mg/dL — ABNORMAL LOW (ref 6–23)
Calcium: 9 mg/dL (ref 8.4–10.5)
Creatinine, Ser: 0.58 mg/dL (ref 0.50–1.10)
GFR calc Af Amer: >90 mL/min (ref >90)
Glucose, Bld: 109 mg/dL — ABNORMAL HIGH (ref 70–99)
Total Protein: 6 g/dL (ref 6.0–8.3)

## 2013-03-09 LAB — MRSA PCR SCREENING: MRSA by PCR: NEGATIVE

## 2013-03-09 LAB — URIC ACID: Uric Acid, Serum: 7.1 mg/dL — ABNORMAL HIGH (ref 2.4–7.0)

## 2013-03-09 LAB — LACTATE DEHYDROGENASE: LDH: 330 U/L — ABNORMAL HIGH (ref 94–250)

## 2013-03-09 MED ORDER — LACTATED RINGERS IV SOLN
INTRAVENOUS | Status: DC
  Administered 2013-03-09 (×2): via INTRAVENOUS

## 2013-03-09 MED ORDER — MAGNESIUM SULFATE 40 G IN LACTATED RINGERS - SIMPLE
2.0000 g/h | INTRAVENOUS | Status: AC
  Filled 2013-03-09: qty 500

## 2013-03-09 MED ORDER — MAGNESIUM SULFATE 40 G IN LACTATED RINGERS - SIMPLE
2.0000 g/h | Freq: Once | INTRAVENOUS | Status: AC
  Administered 2013-03-09: 2 g/h via INTRAVENOUS
  Filled 2013-03-09: qty 500

## 2013-03-09 NOTE — Progress Notes (Signed)
Post Partum Day 1  Subjective: No headache, blurred vision, or RUQ tenderness.  Objective: Blood pressure 128/86, pulse 104, temperature 98 F (36.7 C), temperature source Oral, resp. rate 18, height 5\' 5"  (1.651 m), weight 186 lb 9.6 oz (84.641 kg), last menstrual period 05/24/2012, SpO2 100.00%, unknown if currently breastfeeding.  Physical Exam:  General: alert Lochia: appropriate Uterine Fundus: firm Incision: NA DVT Evaluation: No evidence of DVT seen on physical exam. Negative Homan's sign. Reflexes are normal.  CBC    Component Value Date/Time   WBC 14.4* 03/09/2013 0330   RBC 2.98* 03/09/2013 0330   HGB 8.6* 03/09/2013 0330   HCT 25.8* 03/09/2013 0330   PLT 228 03/09/2013 0330   MCV 86.6 03/09/2013 0330   MCH 28.9 03/09/2013 0330   MCHC 33.3 03/09/2013 0330   RDW 14.7 03/09/2013 0330   LYMPHSABS 3.2 07/05/2012 1611   MONOABS 0.6 07/05/2012 1611   EOSABS 0.1 07/05/2012 1611   BASOSABS 0.0 07/05/2012 1611   CMP     Component Value Date/Time   NA 136 03/09/2013 0330   K 3.7 03/09/2013 0330   CL 104 03/09/2013 0330   CO2 21 03/09/2013 0330   GLUCOSE 109* 03/09/2013 0330   BUN 5* 03/09/2013 0330   CREATININE 0.58 03/09/2013 0330   CREATININE 0.51 03/07/2013 0540   CALCIUM 9.0 03/09/2013 0330   PROT 6.0 03/09/2013 0330   ALBUMIN 2.4* 03/09/2013 0330   AST 29 03/09/2013 0330   ALT 20 03/09/2013 0330   ALKPHOS 102 03/09/2013 0330   BILITOT 0.1* 03/09/2013 0330   GFRNONAA >90 03/09/2013 0330   GFRAA >90 03/09/2013 0330      Recent Labs  03/07/13 0355 03/09/13 0330  HGB 10.1* 8.6*  HCT 30.5* 25.8*    Assessment/Plan:  Preeclampsia - stable Anemia  Discontinue magnesium after 24 hours. Continue ICU support for now.   LOS: 2 days   Audrena Talaga V 03/09/2013, 1:21 PM

## 2013-03-09 NOTE — Progress Notes (Signed)
UR chart review completed.  

## 2013-03-09 NOTE — Progress Notes (Signed)
S: pt pumping.  Newborn at bs w/ female visitor.  Pt currently denies HA, visual disturbances, SOB, epigastric pain. O:  .Marland Kitchen Filed Vitals:   03/08/13 0558 03/08/13 1030 03/08/13 1800 03/08/13 2115  BP: 135/82 147/88 142/84 143/82  Pulse: 115 110 112 111  Temp: 98.5 F (36.9 C) 97.8 F (36.6 C) 98.5 F (36.9 C)   TempSrc:  Oral Oral   Resp: 18 20 20 18   Height:      Weight:      SpO2:      24 hr urine=424 mg total protein PE: Gen: NAD, A&Ox3        Abd: soft, NT, FF, below umbilicus        Ext: 1+ BLE pitting edema; no clonus, DTRs +2  A:  PPD#1 s/p SVD      Mild PreEclampsia based on 24 hr urine; systolics in 140s, and diastolics nml      Lactating P:  Per c/w dr. Stefano Gaul, will trx to AICU for magnesium sulfate therapy x24 hrs; will draw PIH labs at 0500 w/ mag level       R/b/a rev'd w/ pt, and after recommending magnesium for cont'd treatment of condition, pt agreeable to proceed

## 2013-03-10 MED ORDER — MEDROXYPROGESTERONE ACETATE 150 MG/ML IM SUSP
150.0000 mg | Freq: Once | INTRAMUSCULAR | Status: AC
  Administered 2013-03-10: 150 mg via INTRAMUSCULAR
  Filled 2013-03-10: qty 1

## 2013-03-10 MED ORDER — IBUPROFEN 600 MG PO TABS: 600.0000 mg | ORAL_TABLET | Freq: Four times a day (QID) | ORAL | Status: DC

## 2013-03-10 MED ORDER — OXYCODONE-ACETAMINOPHEN 5-325 MG PO TABS: 1.0000 | ORAL_TABLET | ORAL | Status: DC | PRN

## 2013-03-10 NOTE — Progress Notes (Signed)
Patient given discharge instructions and questions answered. [employer] RN called and message left in regards to setting up a RN to come check BP Monday, patient aware. Patient ambulated out with NT.

## 2013-03-10 NOTE — Discharge Summary (Signed)
Obstetric Discharge Summary Reason for Admission: rupture of membranes Prenatal Procedures: none Intrapartum Procedures: spontaneous vaginal delivery Postpartum Procedures: magnesium sulfate Complications-Operative and Postpartum: Postpartum preeclampsia Hemoglobin  Date Value Range Status  03/09/2013 8.6* 12.0 - 15.0 g/dL Final     HCT  Date Value Range Status  03/09/2013 25.8* 36.0 - 46.0 % Final   Results for orders placed during the hospital encounter of 03/07/13 (from the past 48 hour(s))  CBC     Status: Abnormal   Collection Time    03/09/13  3:30 AM      Result Value Range   WBC 14.4 (*) 4.0 - 10.5 K/uL   RBC 2.98 (*) 3.87 - 5.11 MIL/uL   Hemoglobin 8.6 (*) 12.0 - 15.0 g/dL   HCT 16.1 (*) 09.6 - 04.5 %   MCV 86.6  78.0 - 100.0 fL   MCH 28.9  26.0 - 34.0 pg   MCHC 33.3  30.0 - 36.0 g/dL   RDW 40.9  81.1 - 91.4 %   Platelets 228  150 - 400 K/uL  COMPREHENSIVE METABOLIC PANEL     Status: Abnormal   Collection Time    03/09/13  3:30 AM      Result Value Range   Sodium 136  135 - 145 mEq/L   Potassium 3.7  3.5 - 5.1 mEq/L   Chloride 104  96 - 112 mEq/L   CO2 21  19 - 32 mEq/L   Glucose, Bld 109 (*) 70 - 99 mg/dL   BUN 5 (*) 6 - 23 mg/dL   Creatinine, Ser 7.82  0.50 - 1.10 mg/dL   Calcium 9.0  8.4 - 95.6 mg/dL   Total Protein 6.0  6.0 - 8.3 g/dL   Albumin 2.4 (*) 3.5 - 5.2 g/dL   AST 29  0 - 37 U/L   ALT 20  0 - 35 U/L   Alkaline Phosphatase 102  39 - 117 U/L   Total Bilirubin 0.1 (*) 0.3 - 1.2 mg/dL   GFR calc non Af Amer >90  >90 mL/min   GFR calc Af Amer >90  >90 mL/min   Comment:            The eGFR has been calculated     using the CKD EPI equation.     This calculation has not been     validated in all clinical     situations.     eGFR's persistently     <90 mL/min signify     possible Chronic Kidney Disease.  LACTATE DEHYDROGENASE     Status: Abnormal   Collection Time    03/09/13  3:30 AM      Result Value Range   LDH 330 (*) 94 - 250 U/L  URIC  ACID     Status: Abnormal   Collection Time    03/09/13  3:30 AM      Result Value Range   Uric Acid, Serum 7.1 (*) 2.4 - 7.0 mg/dL  MAGNESIUM     Status: Abnormal   Collection Time    03/09/13  3:30 AM      Result Value Range   Magnesium 2.7 (*) 1.5 - 2.5 mg/dL  MRSA PCR SCREENING     Status: None   Collection Time    03/09/13  7:16 AM      Result Value Range   MRSA by PCR NEGATIVE  NEGATIVE   Comment:            The  GeneXpert MRSA Assay (FDA     approved for NASAL specimens     only), is one component of a     comprehensive MRSA colonization     surveillance program. It is not     intended to diagnose MRSA     infection nor to guide or     monitor treatment for     MRSA infections.   Physical Exam:  General: alert, cooperative, appears stated age, no distress and mildly obese Lochia: appropriate Uterine Fundus: firm Incision: n/a DVT Evaluation: No evidence of DVT seen on physical exam.  Discharge Diagnoses: Term Pregnancy-delivered, Preelampsia and anemia.  Hx of marijuana use.  Asthma.  Cigarette smoker  Discharge Information: Date: 03/10/2013 Activity: pelvic rest Diet: routine Medications: see AVS .MED Condition: stable and improved Instructions: refer to practice specific booklet Discharge to: home Follow-up Information   Follow up with [facility]. Call in 6 weeks.   Contact information:   [address] [place] [address]       Newborn Data: Live born female  Birth Weight: 6 lb 4 oz (2835 g) APGAR: 8, 9  Home with mother.  Tiffany [name] P 03/10/2013, 9:44 AM

## 2013-03-10 NOTE — Progress Notes (Signed)
Clinical Social Work Department PSYCHOSOCIAL ASSESSMENT - MATERNAL/CHILD 03/10/2013  Patient:  [name],[name]  Account Number:  [id number]  Admit Date:  03/07/2013  Childs Name:   Tiffany [name]    Clinical Social Worker:  Hephzibah Strehle, LCSW   Date/Time:  03/10/2013 09:30 AM  Date Referred:  03/10/2013   Referral source  CN     Referred reason  Substance Abuse   Other referral source:    I:  FAMILY / HOME ENVIRONMENT Child's legal guardian:  PARENT  Guardian - Name Guardian - Age Guardian - Address  Tiffany [name] 21 [address] [place], [place] [address]  Tiffany [name]  does not live with MOB   Other household support members/support persons Other support:   MOB states her 21 year old sister, father and grandmother are her greatest support people.  She states a poor relationship with her mother and states that FOB is questioning paternity.    II  PSYCHOSOCIAL DATA Information Source:  Patient Interview  Financial and Community Resources Employment:   Financial resources:  Medicaid If Medicaid - County:  [place] Other  WIC  Food Stamps   School / Grade:   Maternity Care Coordinator / Child Services Coordination / Early Interventions:  Cultural issues impacting care:   None indicated    III  STRENGTHS Strengths  Adequate Resources  Compliance with medical plan  Home prepared for Child (including basic supplies)  Supportive family/friends   Strength comment:    IV  RISK FACTORS AND CURRENT PROBLEMS Current Problem:  None     V  SOCIAL WORK ASSESSMENT  CSW met with MOB in her AICU room prior to discharge today to complete assessment for hx of marijuana use.  MOB was very pleasant and states she and baby are doing well.  She states she has everything she needs for baby at home and people in her life who are going to help her with the baby.  It sounds like FOB is minimally involved.  CSW asked her where the baby's last name came from and she  states it is her father's last name.  She asked if this would "cause problems" in the future.  She states her mother said it will.  CSW informed her that she can give the baby any last name she wants and people do this all the time.  CSW is not aware of "problems" it will cause.  CSW asked about MOB's marijuana use and explained hospital drug screen policy.  MOB states she used to smoke regularly, but only smoked "rarely" while she was pregnant and then she stopped.  She thinks the last time she smoked was in January.  She states plans to start smoking again.  She was understanding of hospital drug screen policy.  She states no questions or needs at this time.   VI SOCIAL WORK PLAN Social Work Plan  No Further Intervention Required / No Barriers to Discharge   Type of pt/family education:   Hospital drug screen policy   If child protective services report - county:   If child protective services report - date:   Information/referral to community resources comment:   No referral needs noted at this time.   Other social work plan:    

## 2013-08-01 ENCOUNTER — Emergency Department (HOSPITAL_COMMUNITY): Payer: Medicaid Other

## 2013-08-01 ENCOUNTER — Encounter (HOSPITAL_COMMUNITY): Payer: Self-pay | Admitting: *Deleted

## 2013-08-01 ENCOUNTER — Emergency Department (HOSPITAL_COMMUNITY)
Admission: EM | Admit: 2013-08-01 | Discharge: 2013-08-01 | Disposition: A | Discharge status: Home / Self Care | Payer: Medicaid Other | Attending: Emergency Medicine | Admitting: Emergency Medicine

## 2013-08-01 DIAGNOSIS — Z8719 Personal history of other diseases of the digestive system: Secondary | ICD-10-CM | POA: Insufficient documentation

## 2013-08-01 DIAGNOSIS — J45901 Unspecified asthma with (acute) exacerbation: Secondary | ICD-10-CM | POA: Insufficient documentation

## 2013-08-01 DIAGNOSIS — Z862 Personal history of diseases of the blood and blood-forming organs and certain disorders involving the immune mechanism: Secondary | ICD-10-CM | POA: Insufficient documentation

## 2013-08-01 DIAGNOSIS — F17200 Nicotine dependence, unspecified, uncomplicated: Secondary | ICD-10-CM | POA: Insufficient documentation

## 2013-08-01 DIAGNOSIS — J029 Acute pharyngitis, unspecified: Secondary | ICD-10-CM | POA: Insufficient documentation

## 2013-08-01 LAB — RAPID STREP SCREEN (MED CTR MEBANE ONLY): Streptococcus, Group A Screen (Direct): NEGATIVE

## 2013-08-01 MED ORDER — DEXAMETHASONE 6 MG PO TABS
12.0000 mg | ORAL_TABLET | Freq: Once | ORAL | Status: AC
  Administered 2013-08-01: 12 mg via ORAL
  Filled 2013-08-01: qty 2

## 2013-08-01 NOTE — ED Notes (Signed)
Pt states that for one week the right side of her throat feels swollen and she can't take a deep breath without feeling like something is going to pop.

## 2013-08-01 NOTE — ED Provider Notes (Signed)
CSN: [id number]     Arrival date & time 08/01/13  4098 History   First MD Initiated Contact with Patient 08/01/13 [phone]     Chief Complaint  Patient presents with  . Shortness of Breath   (Consider location/radiation/quality/duration/timing/severity/associated sxs/prior Treatment) Patient is a 22 y.o. female presenting with shortness of breath. The history is provided by the patient.  Shortness of Breath She has had difficulty breathing for the past week. She describes the difficulty breathing has a sense that the right side of her neck is swollen and that she can't get a deep breath in because of the swelling. Her throat has felt scratchy but not painful. There is some irritation when she tries to swallow. She denies fever, chills, sweats. She denies nausea or vomiting. She's not coughing. She states that she's having difficulty sleeping because of the sensation of swelling in her neck. Of note, she is 4 months postpartum and not breast feeding.  Past Medical History  Diagnosis Date  . Asthma   . Anemia   . GERD (gastroesophageal reflux disease)   . Shortness of breath    Past Surgical History  Procedure Laterality Date  . Wisdom tooth extraction  2009   Family History  Problem Relation Age of Onset  . Other Neg Hx   . Alcohol abuse Neg Hx   . Arthritis Neg Hx   . Birth defects Neg Hx   . Cancer Neg Hx   . COPD Neg Hx   . Depression Neg Hx   . Diabetes Neg Hx   . Drug abuse Neg Hx   . Early death Neg Hx   . Hearing loss Neg Hx   . Heart disease Neg Hx   . Hyperlipidemia Neg Hx   . Hypertension Neg Hx   . Kidney disease Neg Hx   . Learning disabilities Neg Hx   . Mental illness Neg Hx   . Mental retardation Neg Hx   . Miscarriages / Stillbirths Neg Hx   . Stroke Neg Hx   . Vision loss Neg Hx   . Asthma Sister   . Asthma Brother   . Asthma Other   . Asthma Other    History  Substance Use Topics  . Smoking status: Current Every Day Smoker -- 0.10 packs/day for 1 years     Types: Cigars  . Smokeless tobacco: Never Used  . Alcohol Use: Yes     Comment: RARELY   OB History   Grav Para Term Preterm Abortions TAB SAB Ect Mult Living   1 1 1  0 0 0 0 0 0 1     Review of Systems  Respiratory: Positive for shortness of breath.   All other systems reviewed and are negative.    Allergies  Review of patient's allergies indicates no known allergies.  Home Medications   Current Outpatient Rx  Name  Route  Sig  Dispense  Refill  . ibuprofen (ADVIL,MOTRIN) 200 MG tablet   Oral   Take 200 mg by mouth every 6 (six) hours as needed for pain.          BP 141/84  Pulse 89  Temp(Src) 98.6 F (37 C) (Oral)  Resp 16  Ht 5\' 3"  (1.6 m)  Wt 170 lb (77.111 kg)  BMI 30.12 kg/m2  SpO2 99%  LMP 05/01/2012  Breastfeeding? No Physical Exam  Nursing note and vitals reviewed.  22 year old female, resting comfortably and in no acute distress. Vital signs are significant  for borderline hypertension with blood pressure 141/84. Oxygen saturation is 99%, which is normal. Head is normocephalic and atraumatic. PERRLA, EOMI. Oropharynx shows moderate tonsillar hypertrophy bilaterally without erythema or exudate. She has no difficulty with secretions and phonation is normal. Neck is nontender and supple without adenopathy or JVD. There is a suggestion of a slight hypertrophy of the right lobe of the thyroid. There is no stridor. Back is nontender and there is no CVA tenderness. Lungs are clear without rales, wheezes, or rhonchi. Chest is nontender. Heart has regular rate and rhythm without murmur. Abdomen is soft, flat, nontender without masses or hepatosplenomegaly and peristalsis is normoactive. Extremities have no cyanosis or edema, full range of motion is present. Skin is warm and dry without rash. Neurologic: Mental status is normal, cranial nerves are intact, there are no motor or sensory deficits.  ED Course  Procedures (including critical care time) Results  for orders placed during the hospital encounter of 08/01/13  RAPID STREP SCREEN      Result Value Range   Streptococcus, Group A Screen (Direct) NEGATIVE  NEGATIVE   Dg Neck Soft Tissue  08/01/2013   *RADIOLOGY REPORT*  Clinical Data: Sore throat with swelling and dyspnea.  NECK SOFT TISSUES - 1+ VIEW  Comparison: None.  Findings: Normal prevertebral soft tissue thickness.  There is mild enlargement of the adenoid tonsil.  No epiglottic thickening when considering mild rotation.  The aryepiglottic folds are smooth and thin.  Early mineralization in the arytenoid cartilages. Normal osseous structures.  IMPRESSION: Negative neck radiograph.   Original Report Authenticated By: Tiburcio Pea   Images viewed by me.  MDM  No diagnosis found. Mild neck pain with tonsillar hypertrophy suggesting viral pharyngitis/tonsillitis. Subjective sense of dyspnea without any objective findings other than possible enlarged right lobe of thyroid gland. She will be sent for soft tissue neck x-ray and strep screen obtained.  Strep screen is negative as are soft tissue neck x-rays. She'll be treated for viral pharyngitis with a dose of dexamethasone. She is to followup with her PCP, return to the ED if symptoms worsen.  Dione Booze, MD 08/01/13 [phone]

## 2013-08-02 LAB — CULTURE, GROUP A STREP

## 2013-08-14 ENCOUNTER — Emergency Department (HOSPITAL_COMMUNITY)
Admission: EM | Admit: 2013-08-14 | Discharge: 2013-08-14 | Disposition: A | Discharge status: Home / Self Care | Payer: Medicaid Other | Attending: Emergency Medicine | Admitting: Emergency Medicine

## 2013-08-14 ENCOUNTER — Encounter (HOSPITAL_COMMUNITY): Payer: Self-pay | Admitting: Emergency Medicine

## 2013-08-14 DIAGNOSIS — Z8719 Personal history of other diseases of the digestive system: Secondary | ICD-10-CM | POA: Insufficient documentation

## 2013-08-14 DIAGNOSIS — Z8709 Personal history of other diseases of the respiratory system: Secondary | ICD-10-CM | POA: Insufficient documentation

## 2013-08-14 DIAGNOSIS — J45909 Unspecified asthma, uncomplicated: Secondary | ICD-10-CM | POA: Insufficient documentation

## 2013-08-14 DIAGNOSIS — F17200 Nicotine dependence, unspecified, uncomplicated: Secondary | ICD-10-CM | POA: Insufficient documentation

## 2013-08-14 DIAGNOSIS — H109 Unspecified conjunctivitis: Secondary | ICD-10-CM | POA: Insufficient documentation

## 2013-08-14 MED ORDER — POLYMYXIN B-TRIMETHOPRIM 10000-0.1 UNIT/ML-% OP SOLN: 1.0000 [drp] | OPHTHALMIC | Status: DC

## 2013-08-14 NOTE — ED Provider Notes (Signed)
CSN: [id number]     Arrival date & time 08/14/13  1036 History  This chart was scribed for Tiffany Bleacher, PA, working with Celene Kras, MD by Blanchard Kelch, ED Scribe. This patient was seen in room TR04C/TR04C and the patient's care was started at 11:40 AM.    Chief Complaint  Patient presents with  . Eye Pain    Patient is a 22 y.o. female presenting with eye pain. The history is provided by the patient. No language interpreter was used.  Eye Pain Pertinent negatives include no chest pain, no abdominal pain and no headaches.    HPI Comments: Tiffany [name] is a 22 y.o. female who presents to the Emergency Department complaining of constant, gradually improving right eye itching with associated swelling and drainage that began about a week and a half ago. She reports using eye drops once for the symptoms without relief. She denies burred vision, rhinorrhea, sore throat or fever. She does not use contacts. She denies any injury or foreign bodies to the area.    Past Medical History  Diagnosis Date  . Asthma   . Anemia   . GERD (gastroesophageal reflux disease)   . Shortness of breath    Past Surgical History  Procedure Laterality Date  . Wisdom tooth extraction  2009   Family History  Problem Relation Age of Onset  . Other Neg Hx   . Alcohol abuse Neg Hx   . Arthritis Neg Hx   . Birth defects Neg Hx   . Cancer Neg Hx   . COPD Neg Hx   . Depression Neg Hx   . Diabetes Neg Hx   . Drug abuse Neg Hx   . Early death Neg Hx   . Hearing loss Neg Hx   . Heart disease Neg Hx   . Hyperlipidemia Neg Hx   . Hypertension Neg Hx   . Kidney disease Neg Hx   . Learning disabilities Neg Hx   . Mental illness Neg Hx   . Mental retardation Neg Hx   . Miscarriages / Stillbirths Neg Hx   . Stroke Neg Hx   . Vision loss Neg Hx   . Asthma Sister   . Asthma Brother   . Asthma Other   . Asthma Other    History  Substance Use Topics  . Smoking status: Current Every Day Smoker -- 0.10  packs/day for 1 years    Types: Cigars  . Smokeless tobacco: Never Used  . Alcohol Use: No     Comment: RARELY   OB History   Grav Para Term Preterm Abortions TAB SAB Ect Mult Living   1 1 1  0 0 0 0 0 0 1     Review of Systems  Constitutional: Negative for fever.  HENT: Negative for sore throat and rhinorrhea.   Eyes: Positive for discharge and itching. Negative for photophobia, pain, redness and visual disturbance.  Respiratory: Negative for cough.   Cardiovascular: Negative for chest pain.  Gastrointestinal: Negative for nausea, vomiting, abdominal pain and diarrhea.  Genitourinary: Negative for dysuria.  Musculoskeletal: Negative for myalgias.  Skin: Negative for rash.  Neurological: Negative for headaches.    Allergies  Review of patient's allergies indicates no known allergies.  Home Medications   Current Outpatient Rx  Name  Route  Sig  Dispense  Refill  . ibuprofen (ADVIL,MOTRIN) 200 MG tablet   Oral   Take 200 mg by mouth every 6 (six) hours as needed for pain.  Triage Vitals: BP 127/75  Pulse 84  Temp(Src) 98.2 F (36.8 C) (Oral)  Resp 20  Ht 5\' 4"  (1.626 m)  Wt 175 lb (79.379 kg)  BMI 30.02 kg/m2  SpO2 100%  LMP 05/01/2012  Physical Exam  Nursing note and vitals reviewed. Constitutional: She appears well-developed and well-nourished.  HENT:  Head: Normocephalic and atraumatic.  Eyes: EOM are normal. Pupils are equal, round, and reactive to light. Right eye exhibits discharge. Right eye exhibits no chemosis. Left eye exhibits no chemosis and no discharge. Right conjunctiva is injected. Right conjunctiva has no hemorrhage. Left conjunctiva is not injected. Left conjunctiva has no hemorrhage. Right eye exhibits normal extraocular motion. Left eye exhibits normal extraocular motion.  Neck: Normal range of motion. Neck supple.  Pulmonary/Chest: No respiratory distress.  Neurological: She is alert.  Skin: Skin is warm and dry.  Psychiatric: She  has a normal mood and affect.    ED Course  Procedures (including critical care time)  DIAGNOSTIC STUDIES: Oxygen Saturation is 100% on room air, normal by my interpretation.    COORDINATION OF CARE:  10:58 AM - Patient verbalizes understanding and agrees with treatment plan.   Labs Review Labs Reviewed - No data to display Imaging Review No results found.  Patient seen and examined. 20/15 visual acuity bilaterally.    Vital signs reviewed and are as follows: Filed Vitals:   08/14/13 1047  BP: 127/75  Pulse: 84  Temp: 98.2 F (36.8 C)  Resp: 20   Pt counseled to f/u with eye specialist if not improved in 3 days.   Patient urged to return with worsening symptoms or other concerns. Patient verbalized understanding and agrees with plan.     MDM   1. Conjunctivitis    Pt with non-painful eye with drainage. Suspect conjunctivitis. No foreign bodies noted. No surrounding erythema, swelling, vision changes/loss suspicious for orbital or periorbital cellulitis. No signs of iritis. No signs of glaucoma. No symptoms of retinal detachment. No ophthalmologic emergency suspected. Outpatient referral given in case of no improvement.    I personally performed the services described in this documentation, which was scribed in my presence. The recorded information has been reviewed and is accurate.    Renne Crigler, PA-C 08/14/13 1233

## 2013-08-14 NOTE — ED Notes (Signed)
Patient states her right eye has been red, swollen and draining for 1 1/2 weeks.   Patient states she was sent home from work because of her eye today.

## 2013-08-16 NOTE — ED Provider Notes (Signed)
Medical screening examination/treatment/procedure(s) were performed by non-physician practitioner and as supervising physician I was immediately available for consultation/collaboration.    Caris Cerveny R Zakariah Urwin, MD 08/16/13 1636 

## 2013-11-12 ENCOUNTER — Encounter (HOSPITAL_COMMUNITY): Payer: Self-pay | Admitting: Emergency Medicine

## 2013-11-12 ENCOUNTER — Emergency Department (HOSPITAL_COMMUNITY): Payer: Medicaid Other

## 2013-11-12 ENCOUNTER — Emergency Department (HOSPITAL_COMMUNITY)
Admission: EM | Admit: 2013-11-12 | Discharge: 2013-11-12 | Disposition: A | Discharge status: Home / Self Care | Payer: Medicaid Other | Attending: Emergency Medicine | Admitting: Emergency Medicine

## 2013-11-12 DIAGNOSIS — F17200 Nicotine dependence, unspecified, uncomplicated: Secondary | ICD-10-CM | POA: Insufficient documentation

## 2013-11-12 DIAGNOSIS — Z791 Long term (current) use of non-steroidal anti-inflammatories (NSAID): Secondary | ICD-10-CM | POA: Insufficient documentation

## 2013-11-12 DIAGNOSIS — M79609 Pain in unspecified limb: Secondary | ICD-10-CM | POA: Insufficient documentation

## 2013-11-12 DIAGNOSIS — J45909 Unspecified asthma, uncomplicated: Secondary | ICD-10-CM | POA: Insufficient documentation

## 2013-11-12 DIAGNOSIS — Z8719 Personal history of other diseases of the digestive system: Secondary | ICD-10-CM | POA: Insufficient documentation

## 2013-11-12 DIAGNOSIS — M779 Enthesopathy, unspecified: Secondary | ICD-10-CM | POA: Insufficient documentation

## 2013-11-12 DIAGNOSIS — Z79899 Other long term (current) drug therapy: Secondary | ICD-10-CM | POA: Insufficient documentation

## 2013-11-12 DIAGNOSIS — M79641 Pain in right hand: Secondary | ICD-10-CM

## 2013-11-12 DIAGNOSIS — Z862 Personal history of diseases of the blood and blood-forming organs and certain disorders involving the immune mechanism: Secondary | ICD-10-CM | POA: Insufficient documentation

## 2013-11-12 MED ORDER — NAPROXEN 375 MG PO TABS: 375.0000 mg | ORAL_TABLET | Freq: Two times a day (BID) | ORAL | Status: DC

## 2013-11-12 NOTE — ED Notes (Signed)
Pt. Stated, i fell last week and my hand and wrist is still hurting.

## 2013-11-12 NOTE — Progress Notes (Signed)
Orthopedic Tech Progress Note Patient Details:  Tiffany [name] Oct 03, 1991 [id number]  Ortho Devices Type of Ortho Device: Thumb velcro splint Ortho Device/Splint Location: RUE Ortho Device/Splint Interventions: Application;Ordered   Jennye Moccasin 11/12/2013, 6:46 PM

## 2013-11-12 NOTE — ED Provider Notes (Signed)
CSN: [id number]     Arrival date & time 11/12/13  1613 History  This chart was scribed for non-physician practitioner Raymon Mutton, PA-C working with Richardean Canal, MD by Caryn Bee, ED Scribe. This patient was seen in room TR05C/TR05C and the patient's care was started at 6:00 PM.      Chief Complaint  Patient presents with  . Hand Pain   HPI HPI Comments: Tiffany [name] is a 22 y.o. female who presents to the Emergency Department complaining of constant, gradually worsening sharp right hand pain that began 7 days ago. Pt reports falling and landing palm down on outstretched wrist - patient is unable to recall exactly how this incident occured. She reports h/o right wrist fracture about 1 year ago. Pain radiates to her right wrist. Reports intermittent tingling. She reports associated swelling on the palm of her right hand near the base of her thumb. Pt has used ice and taken ibuprofen with no relief. Denied loss of sensation.   Past Medical History  Diagnosis Date  . Asthma   . Anemia   . GERD (gastroesophageal reflux disease)   . Shortness of breath    Past Surgical History  Procedure Laterality Date  . Wisdom tooth extraction  2009   Family History  Problem Relation Age of Onset  . Other Neg Hx   . Alcohol abuse Neg Hx   . Arthritis Neg Hx   . Birth defects Neg Hx   . Cancer Neg Hx   . COPD Neg Hx   . Depression Neg Hx   . Diabetes Neg Hx   . Drug abuse Neg Hx   . Early death Neg Hx   . Hearing loss Neg Hx   . Heart disease Neg Hx   . Hyperlipidemia Neg Hx   . Hypertension Neg Hx   . Kidney disease Neg Hx   . Learning disabilities Neg Hx   . Mental illness Neg Hx   . Mental retardation Neg Hx   . Miscarriages / Stillbirths Neg Hx   . Stroke Neg Hx   . Vision loss Neg Hx   . Asthma Sister   . Asthma Brother   . Asthma Other   . Asthma Other    History  Substance Use Topics  . Smoking status: Current Every Day Smoker -- 0.10 packs/day for 1 years     Types: Cigars  . Smokeless tobacco: Never Used  . Alcohol Use: No     Comment: RARELY   OB History   Grav Para Term Preterm Abortions TAB SAB Ect Mult Living   1 1 1  0 0 0 0 0 0 1     Review of Systems  Musculoskeletal: Positive for arthralgias.  All other systems reviewed and are negative.    Allergies  Review of patient's allergies indicates no known allergies.  Home Medications   Current Outpatient Rx  Name  Route  Sig  Dispense  Refill  . albuterol (PROVENTIL HFA;VENTOLIN HFA) 108 (90 BASE) MCG/ACT inhaler   Inhalation   Inhale 1-2 puffs into the lungs every 6 (six) hours as needed for wheezing or shortness of breath.         . naproxen (NAPROSYN) 375 MG tablet   Oral   Take 1 tablet (375 mg total) by mouth 2 (two) times daily.   20 tablet   0    BP 133/75  Pulse 94  Temp(Src) 98.4 F (36.9 C) (Oral)  Resp 16  Wt  179 lb 14.4 oz (81.602 kg)  SpO2 98% Physical Exam  Nursing note and vitals reviewed. Constitutional: She is oriented to person, place, and time. She appears well-developed and well-nourished. No distress.  HENT:  Head: Normocephalic and atraumatic.  Eyes: Conjunctivae and EOM are normal. Pupils are equal, round, and reactive to light. Right eye exhibits no discharge. Left eye exhibits no discharge.  Neck: Normal range of motion. Neck supple.  Cardiovascular: Normal rate, regular rhythm and normal heart sounds.   No murmur heard. Pulses:      Radial pulses are 2+ on the right side, and 2+ on the left side.  Pulmonary/Chest: Effort normal and breath sounds normal. No respiratory distress. She has no wheezes. She has no rales.  Musculoskeletal: She exhibits tenderness.  Mild swelling localized to the thenar and hypothenar region of the right hand. Discomfort upon palpation to the flexor surface of the right hand-palmar aspect. Negative findings of ecchymosis, erythema, inflammation, warmth upon palpation, deformities noted to the right hand or wrist.  Negative swelling localized to the right wrist. Snuffbox tenderness to the right wrist. Discomfort with flexion of the digits of the right hand-negative pain with extension, adduction, abduction of the digits of the right hand. Full radial deviation, ulnar deviation, flexion, extension, supination pronation of the right wrist-mild discomfort with supination pronation identified. Negative pain upon palpation and deformities noted to the right forearm and right elbow.  Neurological: She is alert and oriented to person, place, and time. She exhibits normal muscle tone. Coordination normal.  Strength intact to MCP, PIP, DIP joints of the right hand. Sensation intact with differentiation to sharp and dull touch  Skin: Skin is warm and dry. She is not diaphoretic.  Psychiatric: She has a normal mood and affect. Her behavior is normal. Thought content normal.    ED Course  Procedures (including critical care time) DIAGNOSTIC STUDIES: Oxygen Saturation is 98% on room air, normal by my interpretation.    COORDINATION OF CARE: 6:16 PM-Discussed treatment plan with pt at bedside and pt agreed to plan.   Dg Wrist Complete Right  11/12/2013   CLINICAL DATA:  Fall.  Wrist pain.  Decreased range of motion.  EXAM: RIGHT WRIST - COMPLETE 3+ VIEW  COMPARISON:  None.  FINDINGS: There is no evidence of fracture or dislocation. There is no evidence of arthropathy or other focal bone abnormality. Soft tissues are unremarkable.  IMPRESSION: Negative.   Electronically Signed   By: Myles Rosenthal M.D.   On: 11/12/2013 17:59   Dg Hand Complete Right  11/12/2013   CLINICAL DATA:  Fall.  Hand pain.  Decreased range of motion.  EXAM: RIGHT HAND - COMPLETE 3+ VIEW  COMPARISON:  None.  FINDINGS: There is no evidence of fracture or dislocation. There is no evidence of arthropathy or other focal bone abnormality. Soft tissues are unremarkable.  IMPRESSION: Negative.   Electronically Signed   By: Myles Rosenthal M.D.   On: 11/12/2013  17:58     Labs Review Labs Reviewed - No data to display Imaging Review Dg Wrist Complete Right  11/12/2013   CLINICAL DATA:  Fall.  Wrist pain.  Decreased range of motion.  EXAM: RIGHT WRIST - COMPLETE 3+ VIEW  COMPARISON:  None.  FINDINGS: There is no evidence of fracture or dislocation. There is no evidence of arthropathy or other focal bone abnormality. Soft tissues are unremarkable.  IMPRESSION: Negative.   Electronically Signed   By: Myles Rosenthal M.D.   On: 11/12/2013  17:59   Dg Hand Complete Right  11/12/2013   CLINICAL DATA:  Fall.  Hand pain.  Decreased range of motion.  EXAM: RIGHT HAND - COMPLETE 3+ VIEW  COMPARISON:  None.  FINDINGS: There is no evidence of fracture or dislocation. There is no evidence of arthropathy or other focal bone abnormality. Soft tissues are unremarkable.  IMPRESSION: Negative.   Electronically Signed   By: Myles Rosenthal M.D.   On: 11/12/2013 17:58    EKG Interpretation   None       MDM   1. Right hand pain   2. Tendinitis     Filed Vitals:   11/12/13 1615  BP: 133/75  Pulse: 94  Temp: 98.4 F (36.9 C)  TempSrc: Oral  Resp: 16  Weight: 179 lb 14.4 oz (81.602 kg)  SpO2: 98%    I personally performed the services described in this documentation, which was scribed in my presence. The recorded information has been reviewed and is accurate.  Patient presenting to emergency department with right hand pain that started a week ago when the patient fell and landed on her right hand in an outstretched manner. Patient reported that she has been using Ibuprofen with minimal relief, stated that she has not iced or elevated her hand at all.  Alert and oriented. GCS 15. Heart rate and rhythm normal. Pulses palpable and strong, radial 2+ bilaterally. Mild swelling localized to the thenar and hypothenar region of the right hand. Discomfort upon palpation to the extensor surface of the right hand - palmar aspect. Full range of motion noted to the digits with  discomfort upon flexion. Thumb with full range of motion without difficulty. Full range of motion to the right wrist with discomfort upon supination and pronation. Snuffbox tenderness identified to the right wrist. Strength intact MCP, PIP, DIP joints of the right hand. Strength intact with resistance applied. Sensation intact. Negative deformities, inflammation, erythema identified. Plain films of right hand and right wrist negative for acute fracture. Doubt fracture. Suspicion to be possible bone contusion with tendinitis. Patient stable, afebrile. Patient placed in thumb spica brace for comfort. Patient discharged with anti-inflammatories. Referred patient to orthopedics. Discussed with patient to rest, ice, elevate. Discussed with patient to closely monitor symptoms and if symptoms are to worsen or change to report back to emergency department - strict return structures given. Patient agreed to plan of care, understood, all questions answered.    Raymon Mutton, PA-C 11/14/13 1307

## 2013-11-12 NOTE — ED Notes (Signed)
Called Ortho Tech  

## 2013-11-12 NOTE — ED Notes (Signed)
The pt is c/o rt forearm and hand pain for 7 days.  No known injury.  She uses her hands at work.

## 2013-11-14 NOTE — ED Provider Notes (Signed)
Medical screening examination/treatment/procedure(s) were performed by non-physician practitioner and as supervising physician I was immediately available for consultation/collaboration.  EKG Interpretation   None         Jaidyn Kuhl H Kendrik Mcshan, MD 11/14/13 1431 

## 2013-12-05 ENCOUNTER — Emergency Department (HOSPITAL_COMMUNITY)
Admission: EM | Admit: 2013-12-05 | Discharge: 2013-12-05 | Discharge status: Against Medical Advice | Payer: Medicaid Other | Attending: Emergency Medicine | Admitting: Emergency Medicine

## 2013-12-05 ENCOUNTER — Encounter (HOSPITAL_COMMUNITY): Payer: Self-pay | Admitting: Emergency Medicine

## 2013-12-05 DIAGNOSIS — K219 Gastro-esophageal reflux disease without esophagitis: Secondary | ICD-10-CM | POA: Insufficient documentation

## 2013-12-05 DIAGNOSIS — J45909 Unspecified asthma, uncomplicated: Secondary | ICD-10-CM | POA: Insufficient documentation

## 2013-12-05 DIAGNOSIS — R197 Diarrhea, unspecified: Secondary | ICD-10-CM | POA: Insufficient documentation

## 2013-12-05 DIAGNOSIS — D649 Anemia, unspecified: Secondary | ICD-10-CM | POA: Insufficient documentation

## 2013-12-05 DIAGNOSIS — F17200 Nicotine dependence, unspecified, uncomplicated: Secondary | ICD-10-CM | POA: Insufficient documentation

## 2013-12-05 DIAGNOSIS — R109 Unspecified abdominal pain: Secondary | ICD-10-CM | POA: Insufficient documentation

## 2013-12-05 DIAGNOSIS — R1110 Vomiting, unspecified: Secondary | ICD-10-CM | POA: Insufficient documentation

## 2013-12-05 DIAGNOSIS — Z79899 Other long term (current) drug therapy: Secondary | ICD-10-CM | POA: Insufficient documentation

## 2013-12-05 LAB — CBC WITH DIFFERENTIAL/PLATELET
BASOS ABS: 0 10*3/uL (ref 0.0–0.1)
BASOS PCT: 0 % (ref 0–1)
EOS ABS: 0.1 10*3/uL (ref 0.0–0.7)
EOS PCT: 1 % (ref 0–5)
HCT: 40.9 % (ref 36.0–46.0)
Hemoglobin: 14.2 g/dL (ref 12.0–15.0)
LYMPHS PCT: 28 % (ref 12–46)
Lymphs Abs: 2.7 10*3/uL (ref 0.7–4.0)
MCH: 31.7 pg (ref 26.0–34.0)
MCHC: 34.7 g/dL (ref 30.0–36.0)
MCV: 91.3 fL (ref 78.0–100.0)
Monocytes Absolute: 0.6 10*3/uL (ref 0.1–1.0)
Monocytes Relative: 6 % (ref 3–12)
Neutro Abs: 6.5 10*3/uL (ref 1.7–7.7)
Neutrophils Relative %: 66 % (ref 43–77)
PLATELETS: 275 10*3/uL (ref 150–400)
RBC: 4.48 MIL/uL (ref 3.87–5.11)
RDW: 13 % (ref 11.5–15.5)
WBC: 9.9 10*3/uL (ref 4.0–10.5)

## 2013-12-05 LAB — COMPREHENSIVE METABOLIC PANEL
ALBUMIN: 4.4 g/dL (ref 3.5–5.2)
ALT: 17 U/L (ref 0–35)
AST: 21 U/L (ref 0–37)
Alkaline Phosphatase: 44 U/L (ref 39–117)
BUN: 8 mg/dL (ref 6–23)
CALCIUM: 9.7 mg/dL (ref 8.4–10.5)
CO2: 24 meq/L (ref 19–32)
Chloride: 103 mEq/L (ref 96–112)
Creatinine, Ser: 0.74 mg/dL (ref 0.50–1.10)
GFR calc Af Amer: >90 mL/min (ref >90)
GFR calc non Af Amer: >90 mL/min (ref >90)
Glucose, Bld: 90 mg/dL (ref 70–99)
Potassium: 4.2 mEq/L (ref 3.7–5.3)
SODIUM: 140 meq/L (ref 137–147)
TOTAL PROTEIN: 8.2 g/dL (ref 6.0–8.3)
Total Bilirubin: 0.5 mg/dL (ref 0.3–1.2)

## 2013-12-05 NOTE — ED Notes (Signed)
Pt c/o HA and N/V/D starting yesterday

## 2013-12-05 NOTE — ED Notes (Signed)
Patient unable to void at this time

## 2013-12-26 ENCOUNTER — Emergency Department: Payer: Self-pay | Admitting: Emergency Medicine

## 2014-02-16 IMAGING — US US OB TRANSVAGINAL
1 series · 14 of 28 positions shown · non-contrast
Comparison: none

[Series 1: us ob transvaginal · 36 acquisitions, 14 frames shown]
[im 2/36]
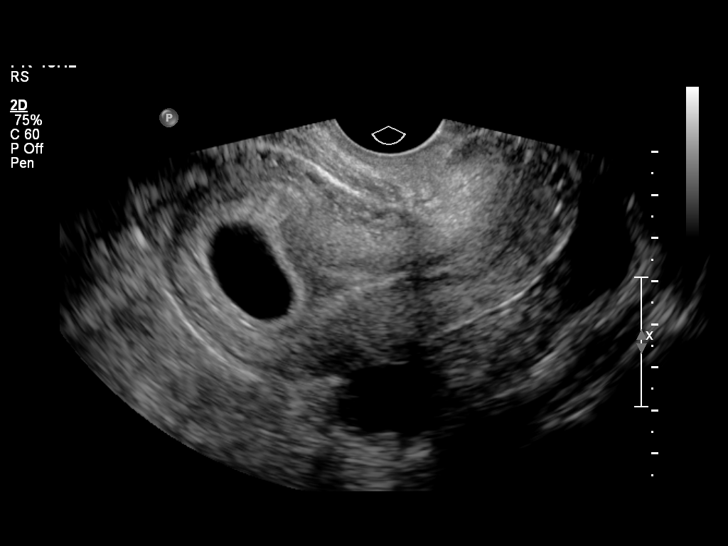
[im 4/36]
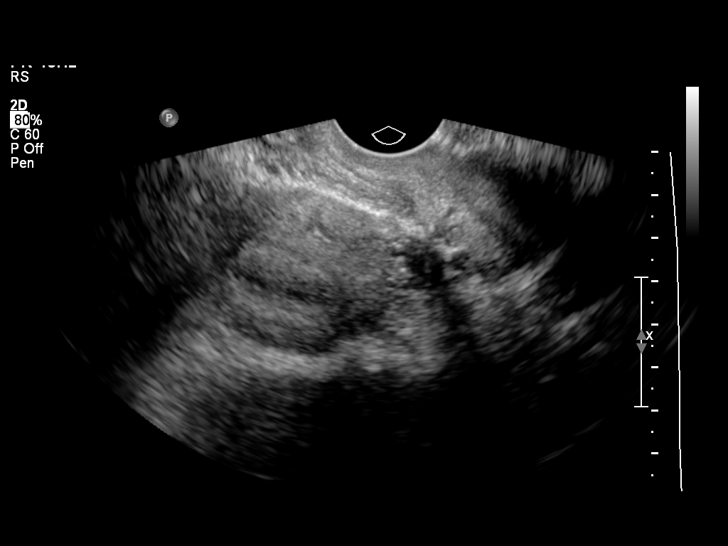
[im 7/36]
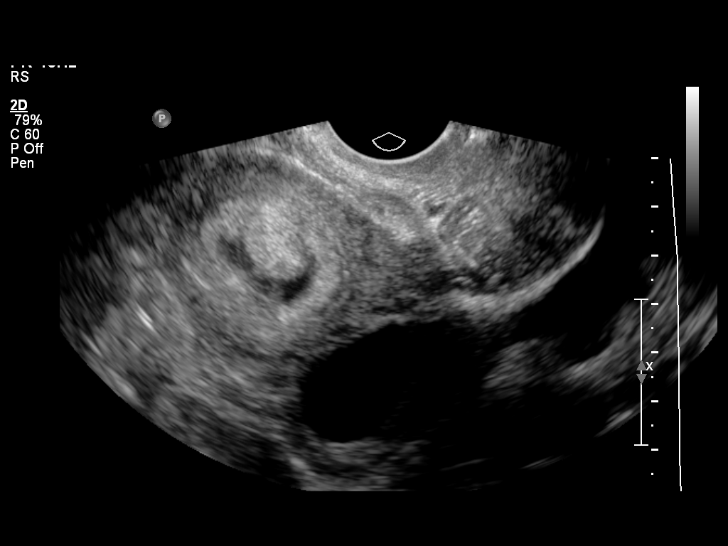
[im 10/36]
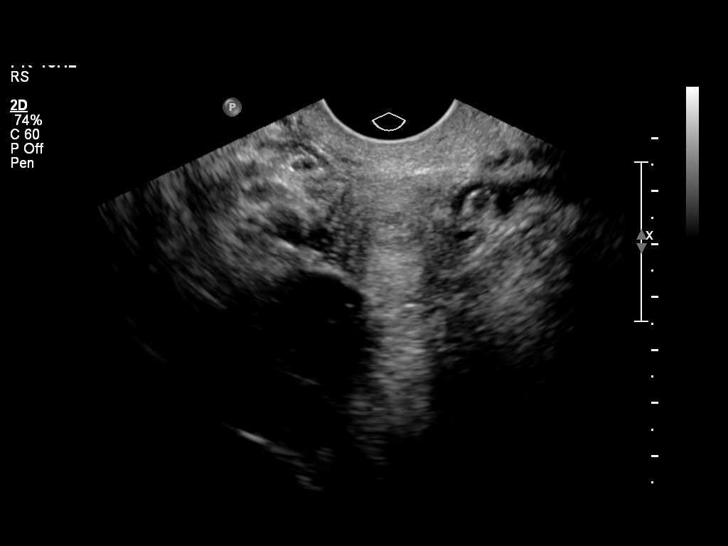
[im 12/36]
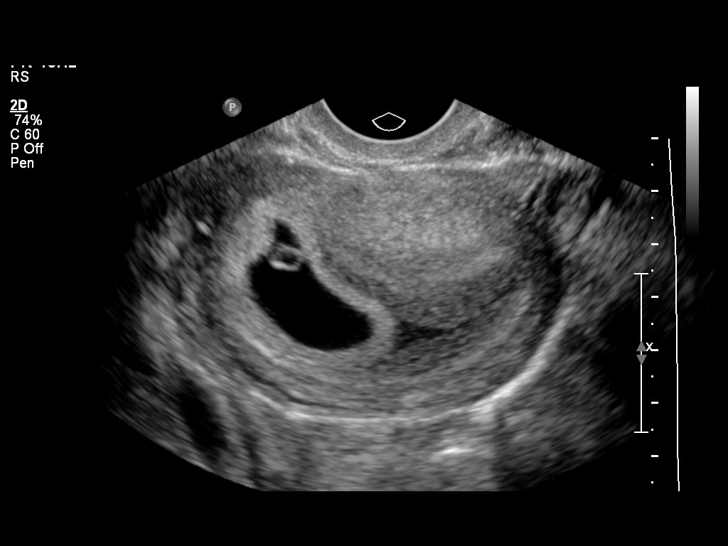
[im 15/36]
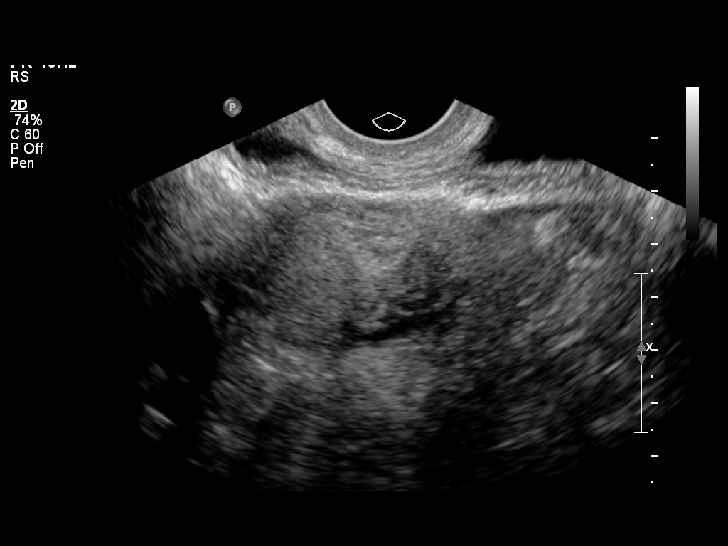
[im 17/36]
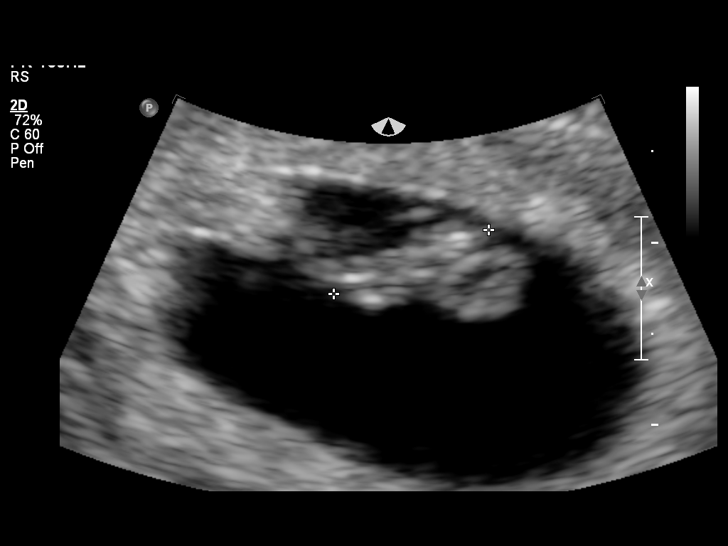
[im 20/36]
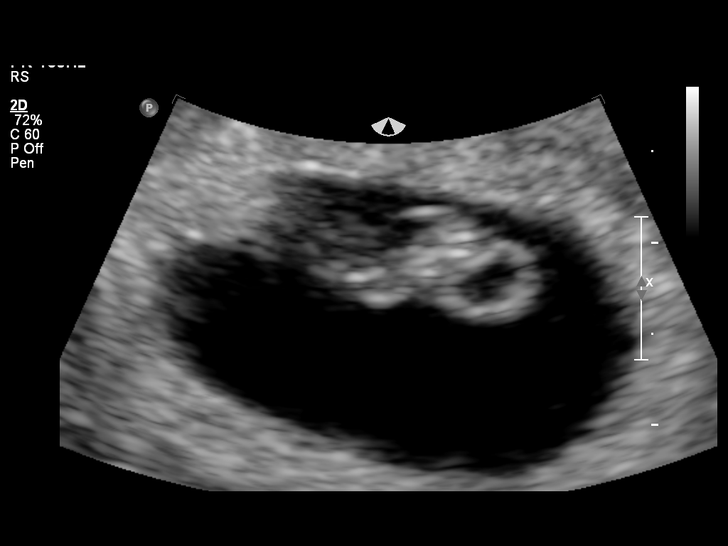
[im 23/36]
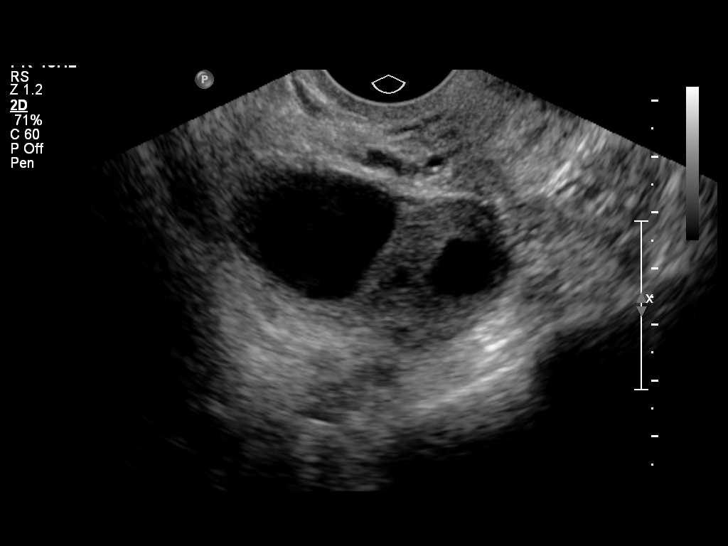
[im 25/36]
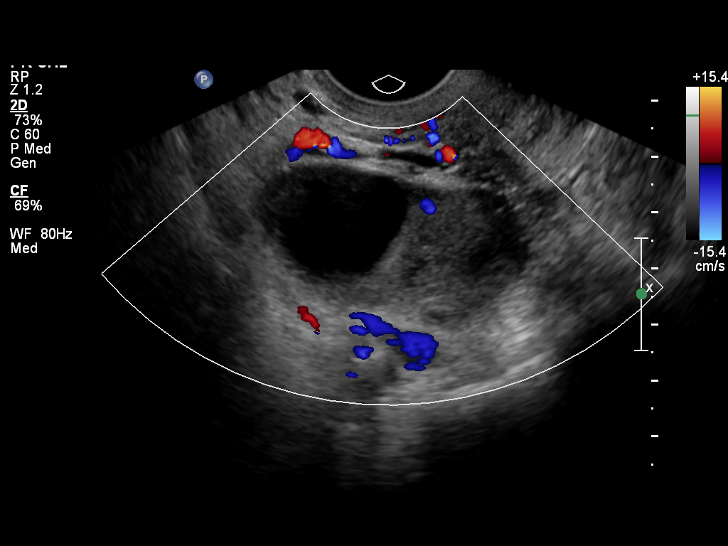
[im 28/36]
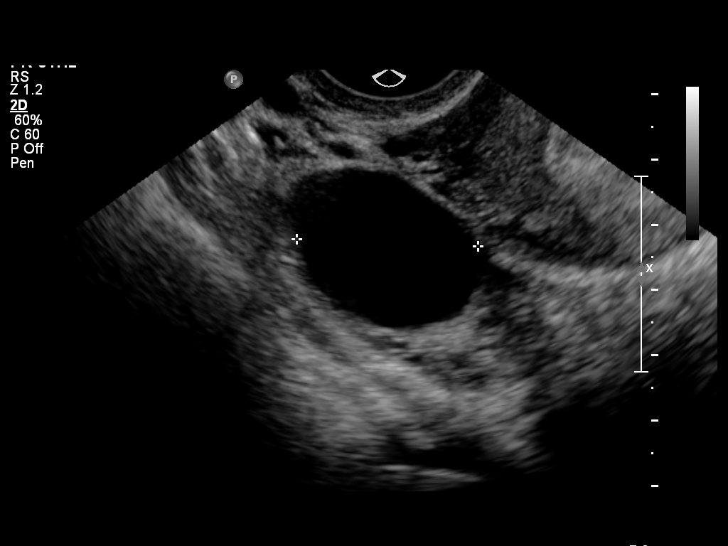
[im 30/36]
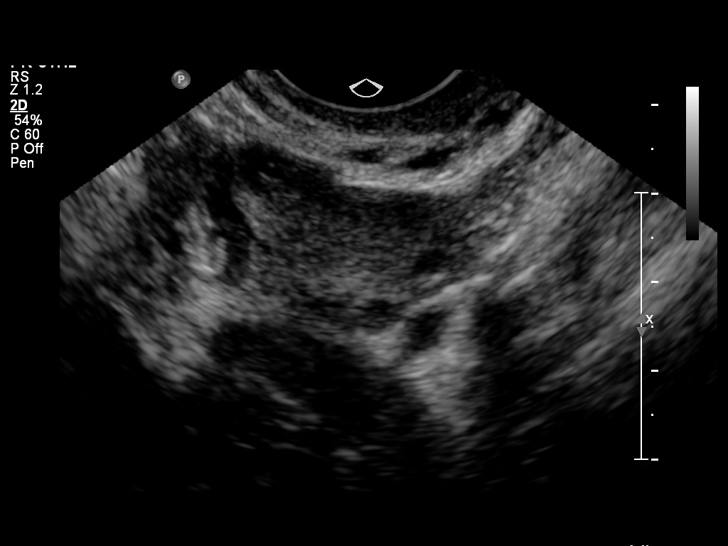
[im 33/36]
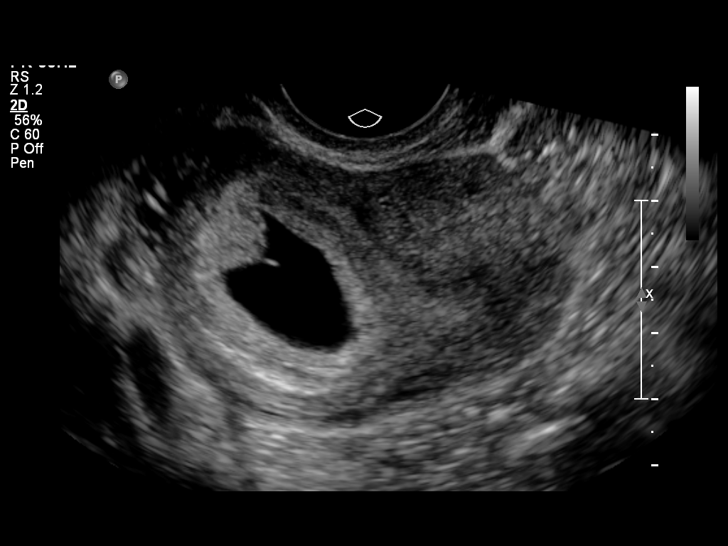
[im 36/36]
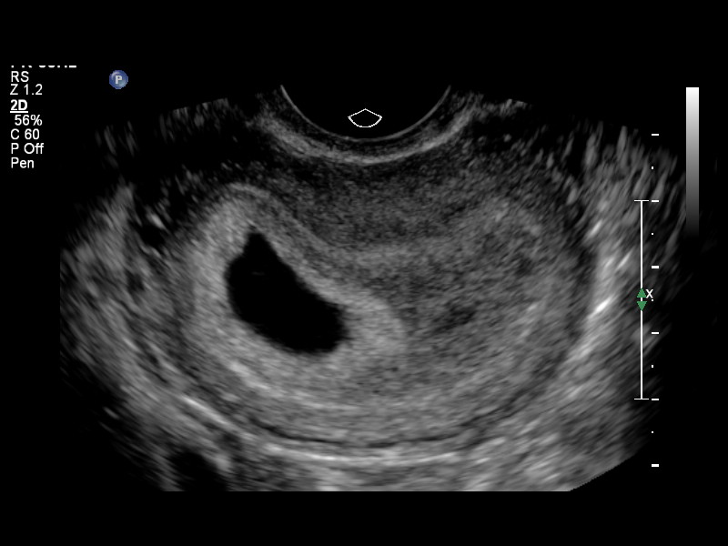

[14 of 28 positions shown; findings below may reference images not displayed]

OBSTETRICS REPORT
                      (Signed Final 07/22/2012 [DATE])

 Order#:         [id number]
Procedures

 US OB TRANSVAGINAL                                    76817.0
Indications

 Pregnancy with inconclusive fetal viability
 Cigarette smoker
Fetal Evaluation

 Preg. Location:    Intrauterine
 Gest. Sac:         Intrauterine, small
                    subchorionic bleed
 [place] Sac:          Visualized
 Fetal Pole:        Visualized
 Fetal Heart Rate:  126                         bpm
 Cardiac Activity:  Observed

 Comment:    Chorionic bump visualized again today.
Biometry

 CRL:      9.2  mm    G. Age:   7w 0d                  EDD:   03/10/13
Gestational Age

 Best:          7w 0d     Det. By:   U/S C R L (07/22/12)     EDD:   03/10/13
Cervix Uterus Adnexa

 Cervix:       Normal appearance by transvaginal scan
 Cul De Sac:   Trace amount of free fluid seen.
 Left Ovary:   Within normal limits.
 Right Ovary:  Within normal limits. Small corpus luteum noted.
 Adnexa:     No abnormality visualized.
Impression

 Single live IUP measuring 7w3d by today's CRL.
 questions or concerns.

## 2014-08-22 ENCOUNTER — Emergency Department (HOSPITAL_COMMUNITY)
Admission: EM | Admit: 2014-08-22 | Discharge: 2014-08-22 | Disposition: A | Discharge status: Home / Self Care | Payer: Medicaid Other | Attending: Emergency Medicine | Admitting: Emergency Medicine

## 2014-08-22 ENCOUNTER — Encounter (HOSPITAL_COMMUNITY): Payer: Self-pay | Admitting: Emergency Medicine

## 2014-08-22 DIAGNOSIS — M545 Low back pain, unspecified: Secondary | ICD-10-CM | POA: Diagnosis not present

## 2014-08-22 DIAGNOSIS — Z79899 Other long term (current) drug therapy: Secondary | ICD-10-CM | POA: Insufficient documentation

## 2014-08-22 DIAGNOSIS — M542 Cervicalgia: Secondary | ICD-10-CM | POA: Diagnosis not present

## 2014-08-22 DIAGNOSIS — Z862 Personal history of diseases of the blood and blood-forming organs and certain disorders involving the immune mechanism: Secondary | ICD-10-CM | POA: Insufficient documentation

## 2014-08-22 DIAGNOSIS — J45909 Unspecified asthma, uncomplicated: Secondary | ICD-10-CM | POA: Insufficient documentation

## 2014-08-22 DIAGNOSIS — Z8719 Personal history of other diseases of the digestive system: Secondary | ICD-10-CM | POA: Diagnosis not present

## 2014-08-22 DIAGNOSIS — F17200 Nicotine dependence, unspecified, uncomplicated: Secondary | ICD-10-CM | POA: Insufficient documentation

## 2014-08-22 MED ORDER — HYDROCODONE-ACETAMINOPHEN 5-325 MG PO TABS: 1.0000 | ORAL_TABLET | Freq: Four times a day (QID) | ORAL | Status: DC | PRN

## 2014-08-22 MED ORDER — CYCLOBENZAPRINE HCL 10 MG PO TABS: 10.0000 mg | ORAL_TABLET | Freq: Two times a day (BID) | ORAL | Status: DC | PRN

## 2014-08-22 NOTE — ED Notes (Signed)
Pt states that she has had ongoing lower back pain for 4 weeks. Pt states that it is intermittent and hurts up her back now.

## 2014-08-22 NOTE — Discharge Instructions (Signed)
Back Pain, Adult °Low back pain is very common. About 1 in 5 people have back pain. The cause of low back pain is rarely dangerous. The pain often gets better over time. About half of people with a sudden onset of back pain feel better in just 2 weeks. About 8 in 10 people feel better by 6 weeks.  °CAUSES °Some common causes of back pain include: °· Strain of the muscles or ligaments supporting the spine. °· Wear and tear (degeneration) of the spinal discs. °· Arthritis. °· Direct injury to the back. °DIAGNOSIS °Most of the time, the direct cause of low back pain is not known. However, back pain can be treated effectively even when the exact cause of the pain is unknown. Answering your caregiver's questions about your overall health and symptoms is one of the most accurate ways to make sure the cause of your pain is not dangerous. If your caregiver needs more information, he or she may order lab work or imaging tests (X-rays or MRIs). However, even if imaging tests show changes in your back, this usually does not require surgery. °HOME CARE INSTRUCTIONS °For many people, back pain returns. Since low back pain is rarely dangerous, it is often a condition that people can learn to manage on their own.  °· Remain active. It is stressful on the back to sit or stand in one place. Do not sit, drive, or stand in one place for more than 30 minutes at a time. Take short walks on level surfaces as soon as pain allows. Try to increase the length of time you walk each day. °· Do not stay in bed. Resting more than 1 or 2 days can delay your recovery. °· Do not avoid exercise or work. Your body is made to move. It is not dangerous to be active, even though your back may hurt. Your back will likely heal faster if you return to being active before your pain is gone. °· Pay attention to your body when you  bend and lift. Many people have less discomfort when lifting if they bend their knees, keep the load close to their bodies, and  avoid twisting. Often, the most comfortable positions are those that put less stress on your recovering back. °· Find a comfortable position to sleep. Use a firm mattress and lie on your side with your knees slightly bent. If you lie on your back, put a pillow under your knees. °· Only take over-the-counter or prescription medicines as directed by your caregiver. Over-the-counter medicines to reduce pain and inflammation are often the most helpful. Your caregiver may prescribe muscle relaxant drugs. These medicines help dull your pain so you can more quickly return to your normal activities and healthy exercise. °· Put ice on the injured area. °¨ Put ice in a plastic bag. °¨ Place a towel between your skin and the bag. °¨ Leave the ice on for 15-20 minutes, 03-04 times a day for the first 2 to 3 days. After that, ice and heat may be alternated to reduce pain and spasms. °· Ask your caregiver about trying back exercises and gentle massage. This may be of some benefit. °· Avoid feeling anxious or stressed. Stress increases muscle tension and can worsen back pain. It is important to recognize when you are anxious or stressed and learn ways to manage it. Exercise is a great option. °SEEK MEDICAL CARE IF: °· You have pain that is not relieved with rest or medicine. °· You have pain that does not improve in 1 week. °· You have new symptoms. °· You are generally not feeling well. °SEEK   IMMEDIATE MEDICAL CARE IF:  °· You have pain that radiates from your back into your legs. °· You develop new bowel or bladder control problems. °· You have unusual weakness or numbness in your arms or legs. °· You develop nausea or vomiting. °· You develop abdominal pain. °· You feel faint. °Document Released: 11/16/2005 Document Revised: 05/17/2012 Document Reviewed: 03/20/2014 °ExitCare® Patient Information ©2015 [employer]. This information is not intended to replace advice given to you by your health care provider. Make sure you  discuss any questions you have with your health care provider. ° ° ° ° °Emergency Department Resource Guide °1) Find a Doctor and Pay Out of Pocket °Although you won't have to find out who is covered by your insurance plan, it is a good idea to ask around and get recommendations. You will then need to call the office and see if the doctor you have chosen will accept you as a new patient and what types of options they offer for patients who are self-pay. Some doctors offer discounts or will set up payment plans for their patients who do not have insurance, but you will need to ask so you aren't surprised when you get to your appointment. ° °2) Contact Your Local Health Department °Not all health departments have doctors that can see patients for sick visits, but many do, so it is worth a call to see if yours does. If you don't know where your local health department is, you can check in your phone book. The CDC also has a tool to help you locate your state's health department, and many state websites also have listings of all of their local health departments. ° °3) Find a Walk-in Clinic °If your illness is not likely to be very severe or complicated, you may want to try a walk in clinic. These are popping up all over the country in pharmacies, drugstores, and shopping centers. They're usually staffed by nurse practitioners or physician assistants that have been trained to treat common illnesses and complaints. They're usually fairly quick and inexpensive. However, if you have serious medical issues or chronic medical problems, these are probably not your best option. ° °No Primary Care Doctor: °- Call [facility] at  832-8000 - they can help you locate a primary care doctor that  accepts your insurance, provides certain services, etc. °- Physician Referral Service- 1-800-533-3463 ° °Chronic Pain Problems: °Organization         Address  Phone   Notes  °[facility]  (336) 297-2271 Patients need  to be referred by their primary care doctor.  ° °Medication Assistance: °Organization         Address  Phone   Notes  °[place] [facility] [address] °[place], [place] [address] (336) 641-8030 --Must be a resident of [place] °-- Must have NO insurance coverage whatsoever (no Medicaid/ Medicare, etc.) °-- The pt. MUST have a primary care doctor that directs their care regularly and follows them in the community °  °[facility]  (866) 331-1348   °[employer]  (888) 892-1162   ° °Agencies that provide inexpensive medical care: °Organization         Address  Phone   Notes  °[facility]  (336) 832-8035   °[facility]    (336) 832-7272   °[facility] [address] °[place],  [address] (336) 832-4777   °[facility] [address], °[place] (336) 271-4999   °[facility]    (336) 373-0678   °[facility]    (336) 272-1050   °[facility] ° [address], [place] Phone:  (336) 832-4444, Fax:  (336) 832-4440 Hours of Operation:  9 am - 6 pm, M-F.  Also accepts Medicaid/Medicare and self-pay.  °[facility] ° [address], [place] Phone: (336) 832-3150, Fax: (336) 832-3151. Hours of Operation:  8:30 am - 5:30 pm, M-F.  Also accepts Medicaid and self-pay.  °[facility] [place] [address], [place] Phone: (336) 878-6027   °[facility] [address], [place], [place] (336)723-1848, Ext. 123 Mondays & Thursdays: 7-9 AM.  First 15 patients are seen on a first come, first serve basis. °  ° °Medicaid-accepting [place] Providers: ° °Organization         Address  Phone   Notes  °[facility] [address], [place] (336) 641-2100 Also accepts self-pay patients.  °[facility] [address], [place] ° (336) 856-9996   °[facility] [address], [place] (336) 288-8857   °[facility] [address] [place] [address], [place] (336) 299-7000   °Veita Bland [address], [place]  ° (336) 373-1557 Only accepts [place] Access Medicaid patients after they have their name applied to their card.  ° °Self-Pay (no insurance) in [place]: ° °Organization         Address  Phone   Notes  °Sickle Cell Patients, [facility] [address], [place] (336) 832-1970   °[facility] [address], [place] (336) 832-4400   °[facility] [place] ° [address], [place] (336) 992-4800   °[facility]/Dr. Osei-Bonsu ° [address] [place] [address], [place] or [address], [place] (336) 841-8500 Phone number for both [place] and [place] locations is the same.  °[facility] [address], [place] (336) 299-0000   °[facility] [address] [place] [address], [place] or [address] (336) 852-7530 °(336) 878-2260   °[facility] [address], [place] (336) 350-1642, phone; (336) 294-5005, fax Sees patients 1st and 3rd Saturday of every month.  Must not qualify for public or private insurance (i.e. Medicaid, Medicare, [place] Health Choice, Veterans' Benefits) • Household income should be no more than 200% of the poverty level •The clinic cannot treat you if you are pregnant or think you are pregnant • Sexually transmitted diseases are not treated at the clinic.  ° ° °Dental Care: °Organization         Address  Phone  Notes  °[facility] [address],  (336) 641-6152 Accepts children up to age 21 who are enrolled in Medicaid or [place] Health Choice; pregnant women with a Medicaid card; and children who have applied for Medicaid or [place] Health Choice, but were declined, whose  parents can pay a reduced fee at time of service.  °[facility] [place]  [address], [place] (336) 641-7733 Accepts children up to age 21 who are enrolled in Medicaid or [place] Health Choice; pregnant women with a Medicaid card; and children who have applied for Medicaid   or [place] Health Choice, but were declined, whose parents can pay a reduced fee at time of service.  °[facility] ° [address], [place] (336) 641-4533 Patients are seen by appointment only. Walk-ins are not accepted. [facility] will see patients 18 years of age and older. °Monday - Tuesday (8am-5pm) °Most Wednesdays (8:30-5pm) °$30 per visit, cash only  °[facility] ° [address], [place] (336) 641-4533 Patients are seen by appointment only. Walk-ins are not accepted. [facility] will see patients 18 years of age and older. °One Wednesday Evening (Monthly: Volunteer Based).  $30 per visit, cash only  °[facility]  (919) 537-3737 for adults; Children under age 4, call Graduate Pediatric Dentistry at (919) [phone]. Children aged 4-14, please call (919) [phone] to request a pediatric application. ° Dental services are provided in all areas of dental care including fillings, crowns and bridges, complete and partial dentures, implants, gum treatment, root canals, and extractions. Preventive care is also provided. Treatment is provided to both adults and children. °Patients are selected via a lottery and there is often a waiting list. °  °[facility] [address], °[place] ° (336) 763-8833 [email or web address] °  °[facility] [address], [place], [place] (336)723-1848, Ext. 123 Second and Fourth Thursday of each month, opens at 6:30 AM; Clinic ends at 9 AM.  Patients are seen on a first-come first-served basis, and a limited number are seen during each clinic.  ° °[facility] °  [address], [place], [place] (336) 723-7904   Eligibility Requirements °You must have lived in [place], [place], or [place] counties for at least the last three months. °  You cannot be eligible for state or federal sponsored healthcare insurance, including Veterans Administration, Medicaid, or Medicare. °  You generally cannot be eligible for healthcare insurance through your employer.  °  How to apply: °Eligibility screenings are held every Tuesday and Wednesday afternoon from 1:00 pm until 4:00 pm. You do not need an appointment for the interview!  °[facility] [address], [place], [place] 336-631-2330   °[facility]  336-342-8273   °[facility]  336-703-3100   °[facility]  336-570-6415   ° °Behavioral Health Resources in the Community: °Intensive Outpatient Programs °Organization         Address  Phone  Notes  °[facility] [address], [place], [place] 336-878-6098   °[facility] [address], [place], [place] 336-832-9800   °[facility] [address], [place], [place] ° 336-882-2125   °[facility] [address],  °[place],  1-800-853-5163 or 336-641-4981   °Substance Abuse Resources °Organization         Address  Phone  Notes  °[facility]  336-882-2125   °[facility]  336-784-9470   °[facility]  336-285-9073   °[facility]  336-845-3988   °Residential & Outpatient Substance Abuse Program  1-800-659-3381   °Psychological Services °Organization         Address  Phone  Notes  °[facility]  336- 832-9600   °[facility]  336- 378-7881   °[facility] [address], [place] 1-800-853-5163 or 336-641-4981   ° °Mobile Crisis Teams °Organization         Address  Phone    Notes  °[facility], Mobile Crisis Care Unit  1-877-626-1772     °Assertive °[facility] ° [address] [place], [place] 336-834-9664   °Sharon DeEsch [address] °[place] 336-554-5454   ° °Self-Help/Support Groups °Organization         Address  Phone             Notes  °[facility] - variety of support groups  336- 373-1402 Call for more information  °Narcotics Anonymous (NA), [facility] [address], °[place]  2 meetings at this location  ° °Residential Treatment Programs °Organization         Address  Phone  Notes  °[facility] [address],    °[place]  1-866-801-8205   °[facility] ° [address], [place], [place] 704-293-8524   °[facility] [address], [place] 336-845-3988 Admissions: 8am-3pm M-F  °[facility] [address]    °[place], [place] 336-841-1104   °[facility] [address], [place], [place] 336-379-7146   °[facility] [address]  °[place], [place] 336-285-9073   °[facility] Intensive Outpatient [address], [place], [place] 336-852-3033   °[facility] [address]  °[place], [place] 1-877-615-2722 or 336-784-9470   °[facility] [address] [place], [place] 336-227-7417 Accepts Medicaid  °[facility] [address]  °[place] 1-800-659-3381 Substance Abuse/Addiction Treatment  ° °[place] Behavioral Health Resources °Organization         Address  Phone  Notes  °[facility]  (888) 581-9988   °Julie Brannon, PhD [address] [place], [place]   (336) 349-5553 or (336) 951-0000   ° Behavioral   [address] °[place], [place] (336) 349-4454   °[facility] [address], [place], [place] (336) 342-8316 Insurance/Medicaid/sponsorship through [facility]  °[facility] [address]                                     [place], [place] (336) 342-8316 Therapy/tele-psych/case  °[facility] [address]  ° [place], [place] (336) 349-2233    °Dr. Arfeen  (336) 349-4544   °[facility] [place]  [employer] [place] [facility] 1) [address], [place] °2) [address], [place] °3)  [address], [place] (336) 349-3220 °(336) 342-7768 ° °(336) 342-8140   °[place] [facility] (336) 342-1394 or (336) 342-3537 (After Hours)    ° ° ° ° °

## 2014-08-22 NOTE — ED Provider Notes (Signed)
CSN: [id number]     Arrival date & time 08/22/14  1255 History  This chart was scribed for non-physician practitioner, Ladona Mow, PA-C working with Linwood Dibbles, MD by Greggory Stallion, ED scribe. This patient was seen in room TR09C/TR09C and the patient's care was started at 3:31 PM.   Chief Complaint  Patient presents with  . Back Pain   The history is provided by the patient. No language interpreter was used.   HPI Comments: Tiffany [name] is a 23 y.o. female who presents to the Emergency Department complaining of mid back pain that started 4 weeks ago. Denies fall or injury. States pain recently worsened and started radiating up her back and into her neck. She states that the pain is worse in her neck now. Movement worsens the pain. She has taken a muscle relaxer and 400 mg ibuprofen once with no relief. Denies fever, sore throat, cough, bowel or bladder incontinence, saddle anesthesia, weakness. Denies history of back problems, cancer or IV drug use. 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 She states her pain is worse with movement, walking, range of motion. She denies radiation of pain in her legs, arms. Patient states she takes Depo-Provera for method of contraception, and does not currently have periods. Patient states she has not been sexually active for "a long time".  Past Medical History  Diagnosis Date  . Asthma   . Anemia   . GERD (gastroesophageal reflux disease)   . Shortness of breath    Past Surgical History  Procedure Laterality Date  . Wisdom tooth extraction  2009   Family History  Problem Relation Age of Onset  . Other Neg Hx   . Alcohol abuse Neg Hx   . Arthritis Neg Hx   . Birth defects Neg Hx   . Cancer Neg Hx   . COPD Neg Hx   . Depression Neg Hx   . Diabetes Neg Hx   . Drug abuse Neg Hx   . Early death Neg Hx   . Hearing loss Neg Hx   . Heart disease Neg Hx   .  Hyperlipidemia Neg Hx   . Hypertension Neg Hx   . Kidney disease Neg Hx   . Learning disabilities Neg Hx   . Mental illness Neg Hx   . Mental retardation Neg Hx   . Miscarriages / Stillbirths Neg Hx   . Stroke Neg Hx   . Vision loss Neg Hx   . Asthma Sister   . Asthma Brother   . Asthma Other   . Asthma Other    History  Substance Use Topics  . Smoking status: Current Every Day Smoker -- 0.10 packs/day for 1 years    Types: Cigars  . Smokeless tobacco: Never Used  . Alcohol Use: No     Comment: RARELY   OB History   Grav Para Term Preterm Abortions TAB SAB Ect Mult Living   0 0 0 0 0 0 1     Review of Systems  Constitutional: Negative for fever.  HENT: Negative for sore throat.   Respiratory: Negative for cough.   Genitourinary: Negative for dysuria and difficulty urinating.       Negative for bowel or bladder incontinence.  Musculoskeletal: Positive for back pain and neck pain.  Neurological: Negative for dizziness, speech difficulty, weakness, numbness and headaches.  Psychiatric/Behavioral: Negative.   All  other systems reviewed and are negative.  Allergies  Review of patient's allergies indicates no known allergies.  Home Medications   Prior to Admission medications   Medication Sig Start Date End Date Taking? Authorizing Provider  albuterol (PROVENTIL HFA;VENTOLIN HFA) 108 (90 BASE) MCG/ACT inhaler Inhale 1-2 puffs into the lungs every 6 (six) hours as needed for wheezing or shortness of breath.   Yes Historical Provider, MD  cyclobenzaprine (FLEXERIL) 10 MG tablet Take 1 tablet (10 mg total) by mouth 2 (two) times daily as needed for muscle spasms. 08/22/14   Monte Fantasia, PA-C  HYDROcodone-acetaminophen (NORCO/VICODIN) 5-325 MG per tablet Take 1-2 tablets by mouth every 6 (six) hours as needed for moderate pain or severe pain. 08/22/14   Monte Fantasia, PA-C   BP 112/72  Pulse 80  Temp(Src) 98.6 F (37 C) (Oral)  Resp 12  Ht  (1.626 m)  SpO2  99%  Physical Exam  Nursing note and vitals reviewed. Constitutional: She is oriented to person, place, and time. She appears well-developed and well-nourished.  HENT:  Head: Normocephalic and atraumatic.  Mouth/Throat: Oropharynx is clear and moist.  Eyes: Conjunctivae and EOM are normal. Pupils are equal, round, and reactive to light.  Neck: Normal range of motion.  Cardiovascular: Normal rate, regular rhythm and normal heart sounds.   Pulmonary/Chest: Effort normal and breath sounds normal.  Abdominal: Soft. Bowel sounds are normal.  Musculoskeletal: Normal range of motion.  Patient has diffuse tenderness to cervical and lumbar spine on her spinous processes and paraspinous. Patient also has tenderness laterally in her neck, and in her trapezius muscles. Patient has diffuse tenderness in musculature laterally and midline of lumbar back.  Negative straight leg raise test bilaterally.  Neurological: She is alert and oriented to person, place, and time. She has normal strength. No cranial nerve deficit or sensory deficit. She displays a negative Romberg sign. Coordination and gait normal. GCS eye subscore is 4. GCS verbal subscore is 5. GCS motor subscore is 6.  Reflex Scores:      Patellar reflexes are 2+ on the right side and 2+ on the left side.      Achilles reflexes are 2+ on the right side and 2+ on the left side. Normal gait. 5/5 motor strength in all muscle groups of upper and lower extremities.   Skin: Skin is warm and dry.  Psychiatric: She has a normal mood and affect.    ED Course  Procedures (including critical care time)  DIAGNOSTIC STUDIES: Oxygen Saturation is 98% on RA, normal by my interpretation.    COORDINATION OF CARE: 3:39 PM-Discussed treatment plan which includes narcotic pain medication, flexeril and continuing ibuprofen with pt at bedside and pt agreed to plan. Will give pt PCP referrals and advised her to follow up.   Labs Review Labs Reviewed - No data  to display  Imaging Review No results found.   EKG Interpretation None      MDM   Final diagnoses:  Midline low back pain without sciatica  Cervical pain (neck)    Patient with back pain.  No neurological deficits and normal neuro exam.  Patient can walk but states is painful.  No loss of bowel or bladder control.  No concern for cauda equina.  No fever, night sweats, weight loss, h/o cancer, IVDU.  RICE protocol and pain medicine indicated and discussed with patient.    BP 112/72  Pulse 80  Temp(Src) 98.6 F (37 C) (Oral)  Resp 12  Ht  (1.626 m)  SpO2 99%   Signed,  Ladona Mow, PA-C 6:37 PM   I personally performed the services described in this documentation, which was scribed in my presence. The recorded information has been reviewed and is accurate.  Monte Fantasia, PA-C 08/22/14 [phone]

## 2014-08-22 NOTE — ED Notes (Signed)
Started 4 weeks ago with LBP. Yesterday, pain started radiating up her back. States she had to leave work. Works driving a Education officer, [employer].

## 2014-08-23 NOTE — ED Provider Notes (Signed)
Medical screening examination/treatment/procedure(s) were performed by non-physician practitioner and as supervising physician I was immediately available for consultation/collaboration.    Tilly Pernice, MD 08/23/14 0902 

## 2014-10-01 ENCOUNTER — Encounter (HOSPITAL_COMMUNITY): Payer: Self-pay | Admitting: Emergency Medicine

## 2014-12-04 ENCOUNTER — Encounter (HOSPITAL_COMMUNITY): Payer: Self-pay | Admitting: Emergency Medicine

## 2014-12-04 ENCOUNTER — Emergency Department (HOSPITAL_COMMUNITY)
Admission: EM | Admit: 2014-12-04 | Discharge: 2014-12-05 | Disposition: A | Discharge status: Home / Self Care | Payer: Medicaid Other | Attending: Emergency Medicine | Admitting: Emergency Medicine

## 2014-12-04 DIAGNOSIS — Z3202 Encounter for pregnancy test, result negative: Secondary | ICD-10-CM | POA: Insufficient documentation

## 2014-12-04 DIAGNOSIS — Z720 Tobacco use: Secondary | ICD-10-CM | POA: Diagnosis not present

## 2014-12-04 DIAGNOSIS — R059 Cough, unspecified: Secondary | ICD-10-CM

## 2014-12-04 DIAGNOSIS — R110 Nausea: Secondary | ICD-10-CM | POA: Insufficient documentation

## 2014-12-04 DIAGNOSIS — R05 Cough: Secondary | ICD-10-CM | POA: Diagnosis present

## 2014-12-04 DIAGNOSIS — Z8719 Personal history of other diseases of the digestive system: Secondary | ICD-10-CM | POA: Diagnosis not present

## 2014-12-04 DIAGNOSIS — Z862 Personal history of diseases of the blood and blood-forming organs and certain disorders involving the immune mechanism: Secondary | ICD-10-CM | POA: Insufficient documentation

## 2014-12-04 DIAGNOSIS — J069 Acute upper respiratory infection, unspecified: Secondary | ICD-10-CM | POA: Diagnosis not present

## 2014-12-04 DIAGNOSIS — J45901 Unspecified asthma with (acute) exacerbation: Secondary | ICD-10-CM

## 2014-12-04 DIAGNOSIS — Z79899 Other long term (current) drug therapy: Secondary | ICD-10-CM | POA: Diagnosis not present

## 2014-12-04 LAB — POC URINE PREG, ED: Preg Test, Ur: NEGATIVE

## 2014-12-04 LAB — RAPID STREP SCREEN (MED CTR MEBANE ONLY): STREPTOCOCCUS, GROUP A SCREEN (DIRECT): NEGATIVE

## 2014-12-04 MED ORDER — ALBUTEROL SULFATE (2.5 MG/3ML) 0.083% IN NEBU
5.0000 mg | INHALATION_SOLUTION | Freq: Once | RESPIRATORY_TRACT | Status: AC
  Administered 2014-12-04: 5 mg via RESPIRATORY_TRACT
  Filled 2014-12-04: qty 6

## 2014-12-04 MED ORDER — PREDNISONE 20 MG PO TABS
60.0000 mg | ORAL_TABLET | Freq: Every day | ORAL | Status: DC
  Administered 2014-12-04: 60 mg via ORAL
  Filled 2014-12-04: qty 3

## 2014-12-04 NOTE — ED Notes (Signed)
Pt reports productive  cough, congestion, sore throat since 12/31.

## 2014-12-04 NOTE — ED Provider Notes (Signed)
CSN: [id number]     Arrival date & time 12/04/14  2109 History  This chart was scribed for non-physician practitioner, Jinny Sanders, PA-C, working with Benny Lennert, MD, by Bronson Curb, ED Scribe. This patient was seen in room TR07C/TR07C and the patient's care was started at 10:25 PM.   Chief Complaint  Patient presents with  . Cough    The history is provided by the patient. No language interpreter was used.     HPI Comments: Tiffany [name] is a 24 y.o. female who presents to the Emergency Department complaining of persistent, intermittent cough productive of green sputum with "specks of black" that began 6 days ago. There is associated sore throat, nausea, chills, "hot flashes", and nasal congestion. Patient reports history of migraines and states she has been experiencing them more frequently since onset of symptoms. She also notes sharp chest pain with cough only which she references in her lower throat. Patient has history of asthma, but denies frequent or recent use of her inhaler. No aggravating or alleviating factors. Patient denies vomiting.    Past Medical History  Diagnosis Date  . Asthma   . Anemia   . GERD (gastroesophageal reflux disease)   . Shortness of breath    Past Surgical History  Procedure Laterality Date  . Wisdom tooth extraction  2009   Family History  Problem Relation Age of Onset  . Other Neg Hx   . Alcohol abuse Neg Hx   . Arthritis Neg Hx   . Birth defects Neg Hx   . Cancer Neg Hx   . COPD Neg Hx   . Depression Neg Hx   . Diabetes Neg Hx   . Drug abuse Neg Hx   . Early death Neg Hx   . Hearing loss Neg Hx   . Heart disease Neg Hx   . Hyperlipidemia Neg Hx   . Hypertension Neg Hx   . Kidney disease Neg Hx   . Learning disabilities Neg Hx   . Mental illness Neg Hx   . Mental retardation Neg Hx   . Miscarriages / Stillbirths Neg Hx   . Stroke Neg Hx   . Vision loss Neg Hx   . Asthma Sister   . Asthma Brother   . Asthma Other   .  Asthma Other    History  Substance Use Topics  . Smoking status: Current Every Day Smoker -- 0.10 packs/day for 1 years    Types: Cigars  . Smokeless tobacco: Never Used  . Alcohol Use: No     Comment: RARELY   OB History    Gravida Para Term Preterm AB TAB SAB Ectopic Multiple Living   0 0 0 0 0 0 1     Review of Systems  Constitutional: Positive for chills.  HENT: Positive for congestion and sore throat.   Respiratory: Positive for cough.   Cardiovascular: Positive for chest pain (with cough only).  Gastrointestinal: Positive for nausea. Negative for vomiting.  Neurological: Positive for headaches.      Allergies  Review of patient's allergies indicates no known allergies.  Home Medications   Prior to Admission medications   Medication Sig Start Date End Date Taking? Authorizing Provider  albuterol (PROVENTIL HFA;VENTOLIN HFA) 108 (90 BASE) MCG/ACT inhaler Inhale 1-2 puffs into the lungs every 6 (six) hours as needed for wheezing or shortness of breath.    Historical Provider, MD  cyclobenzaprine (FLEXERIL) 10 MG tablet Take 1 tablet (10  mg total) by mouth 2 (two) times daily as needed for muscle spasms. 08/22/14   Monte FantasiaJoseph W Jakeel Starliper, PA-C  HYDROcodone-acetaminophen (NORCO/VICODIN) 5-325 MG per tablet Take 1-2 tablets by mouth every 6 (six) hours as needed for moderate pain or severe pain. 08/22/14   Monte FantasiaJoseph W Thyra Yinger, PA-C  predniSONE (DELTASONE) 20 MG tablet Take 2 tablets (40 mg total) by mouth daily. 12/05/14   Monte FantasiaJoseph W Dshawn Mcnay, PA-C   Triage Vitals: BP 139/92 mmHg  Pulse 92  Temp(Src) 98.7 F (37.1 C) (Oral)  Resp 20  SpO2 98%  Physical Exam  Constitutional: She is oriented to person, place, and time. She appears well-developed and well-nourished. No distress.  HENT:  Head: Normocephalic and atraumatic.  Right Ear: Tympanic membrane, external ear and ear canal normal.  Left Ear: Tympanic membrane, external ear and ear canal normal.  Nose: No mucosal edema.   Mouth/Throat: No oropharyngeal exudate.  TMs clear bilaterally.  Mild erythema without swelling to nasal turbinates bilaterally. Mild tonsillar swelling bilaterally without obvious exudates.  Eyes: Conjunctivae and EOM are normal.  Neck: Neck supple. No tracheal deviation present.  Cardiovascular: Normal rate.   Pulmonary/Chest: Effort normal. No respiratory distress. She has wheezes.  Mild wheezing noted bilaterally in lower lobes.  Musculoskeletal: Normal range of motion.  Lymphadenopathy:    She has cervical adenopathy.  Mild anterior cervical lymphadenopathy.  Neurological: She is alert and oriented to person, place, and time.  Skin: Skin is warm and dry.  Psychiatric: She has a normal mood and affect. Her behavior is normal.  Nursing note and vitals reviewed.   ED Course  Procedures (including critical care time)  DIAGNOSTIC STUDIES: Oxygen Saturation is 98% on room air, normal by my interpretation.    COORDINATION OF CARE: At 2234 Discussed treatment plan with patient which includes CXR and strep test. Patient agrees.   Labs Review Labs Reviewed  RAPID STREP SCREEN  CULTURE, GROUP A STREP  POC URINE PREG, ED    Imaging Review Dg Chest 2 View  12/05/2014   CLINICAL DATA:  Cough and congestion for 5 days.  EXAM: CHEST  2 VIEW  COMPARISON:  12/26/2013  FINDINGS: The heart size and mediastinal contours are within normal limits. Both lungs are clear. The visualized skeletal structures are unremarkable.  IMPRESSION: No active cardiopulmonary disease.   Electronically Signed   By: Burman NievesWilliam  Stevens M.D.   On: 12/05/2014 00:28     EKG Interpretation None      MDM   Final diagnoses:  Cough  URI (upper respiratory infection)  Asthma exacerbation   Patient here with cough, sore throat and nasal congestion the past week. Patient afebrile, non-tachycardic, nontachypneic, non-hypoxic and in no acute distress on initial exam. Mild wheezes noted on initial exam, patient  given nebulizer treatment. Patient has poor follow-up with primary care, states she is an asthmatic. Patient treated with possible asthma exacerbation due to the fact that patient had some mild wheezes and URI symptoms. X-ray unremarkable for acute pathology. On reexamination after nebulizer treatment, patient's lungs are clear and patient remains in no acute distress. Mild tachycardia thought to be due to to albuterol treatment at a heart rate of 102, although patient reports improvement of her symptoms. Patient describing a chest discomfort which is in her lower throat, patient describes as discomfort as her being unable to expectorate the mucus as she is coughing. PERC negative, this discomfort does not sound cardiac or pulmonary in nature, and does show improvement after treatment here. I  discussed the importance of follow-up with a primary care physician to insure resolution of symptoms, and to help follow-up with her asthma on an ongoing basis. Patient states she is out of her current inhaler, we will provide her with one tonight. I discussed return precautions with patient, and patient verbalizes agreement and understanding of this plan. I encouraged patient to call or return to the ER should she have any questions or concerns.  BP 126/82 mmHg  Pulse 102  Temp(Src) 97.6 F (36.4 C) (Oral)  Resp 18  SpO2 100%  Signed,  Ladona Mow, PA-C 3:41 AM    Monte Fantasia, PA-C 12/05/14 4540  Benny Lennert, MD 12/07/14 1322

## 2014-12-05 ENCOUNTER — Emergency Department (HOSPITAL_COMMUNITY): Payer: Medicaid Other

## 2014-12-05 MED ORDER — IBUPROFEN 400 MG PO TABS
600.0000 mg | ORAL_TABLET | Freq: Once | ORAL | Status: AC
  Administered 2014-12-05: 600 mg via ORAL
  Filled 2014-12-05 (×2): qty 1

## 2014-12-05 MED ORDER — ALBUTEROL SULFATE HFA 108 (90 BASE) MCG/ACT IN AERS
1.0000 | INHALATION_SPRAY | Freq: Four times a day (QID) | RESPIRATORY_TRACT | Status: DC | PRN
  Administered 2014-12-05: 1 via RESPIRATORY_TRACT
  Filled 2014-12-05: qty 6.7

## 2014-12-05 MED ORDER — PREDNISONE 20 MG PO TABS: 40.0000 mg | ORAL_TABLET | Freq: Every day | ORAL | Status: DC

## 2014-12-05 NOTE — Discharge Instructions (Signed)
Upper Respiratory Infection, Adult °An upper respiratory infection (URI) is also sometimes known as the common cold. The upper respiratory tract includes the nose, sinuses, throat, trachea, and bronchi. Bronchi are the airways leading to the lungs. Most people improve within 1 week, but symptoms can last up to 2 weeks. A residual cough may last even longer.  °CAUSES °Many different viruses can infect the tissues lining the upper respiratory tract. The tissues become irritated and inflamed and often become very moist. Mucus production is also common. A cold is contagious. You can easily spread the virus to others by oral contact. This includes kissing, sharing a glass, coughing, or sneezing. Touching your mouth or nose and then touching a surface, which is then touched by another person, can also spread the virus. °SYMPTOMS  °Symptoms typically develop 1 to 3 days after you come in contact with a cold virus. Symptoms vary from person to person. They may include: °· Runny nose. °· Sneezing. °· Nasal congestion. °· Sinus irritation. °· Sore throat. °· Loss of voice (laryngitis). °· Cough. °· Fatigue. °· Muscle aches. °· Loss of appetite. °· Headache. °· Low-grade fever. °DIAGNOSIS  °You might diagnose your own cold based on familiar symptoms, since most people get a cold 2 to 3 times a year. Your caregiver can confirm this based on your exam. Most importantly, your caregiver can check that your symptoms are not due to another disease such as strep throat, sinusitis, pneumonia, asthma, or epiglottitis. Blood tests, throat tests, and X-rays are not necessary to diagnose a common cold, but they may sometimes be helpful in excluding other more serious diseases. Your caregiver will decide if any further tests are required. °RISKS AND COMPLICATIONS  °You may be at risk for a more severe case of the common cold if you smoke cigarettes, have chronic heart disease (such as heart failure) or lung disease (such as asthma), or if  you have a weakened immune system. The very young and very old are also at risk for more serious infections. Bacterial sinusitis, middle ear infections, and bacterial pneumonia can complicate the common cold. The common cold can worsen asthma and chronic obstructive pulmonary disease (COPD). Sometimes, these complications can require emergency medical care and may be life-threatening. °PREVENTION  °The best way to protect against getting a cold is to practice good hygiene. Avoid oral or hand contact with people with cold symptoms. Wash your hands often if contact occurs. There is no clear evidence that vitamin C, vitamin E, echinacea, or exercise reduces the chance of developing a cold. However, it is always recommended to get plenty of rest and practice good nutrition. °TREATMENT  °Treatment is directed at relieving symptoms. There is no cure. Antibiotics are not effective, because the infection is caused by a virus, not by bacteria. Treatment may include: °· Increased fluid intake. Sports drinks offer valuable electrolytes, sugars, and fluids. °· Breathing heated mist or steam (vaporizer or shower). °· Eating chicken soup or other clear broths, and maintaining good nutrition. °· Getting plenty of rest. °· Using gargles or lozenges for comfort. °· Controlling fevers with ibuprofen or acetaminophen as directed by your caregiver. °· Increasing usage of your inhaler if you have asthma. °Zinc gel and zinc lozenges, taken in the first 24 hours of the common cold, can shorten the duration and lessen the severity of symptoms. Pain medicines may help with fever, muscle aches, and throat pain. A variety of non-prescription medicines are available to treat congestion and runny nose. Your caregiver   can make recommendations and may suggest nasal or lung inhalers for other symptoms.  °HOME CARE INSTRUCTIONS  °· Only take over-the-counter or prescription medicines for pain, discomfort, or fever as directed by your  caregiver. °· Use a warm mist humidifier or inhale steam from a shower to increase air moisture. This may keep secretions moist and make it easier to breathe. °· Drink enough water and fluids to keep your urine clear or pale yellow. °· Rest as needed. °· Return to work when your temperature has returned to normal or as your caregiver advises. You may need to stay home longer to avoid infecting others. You can also use a face mask and careful hand washing to prevent spread of the virus. °SEEK MEDICAL CARE IF:  °· After the first few days, you feel you are getting worse rather than better. °· You need your caregiver's advice about medicines to control symptoms. °· You develop chills, worsening shortness of breath, or brown or red sputum. These may be signs of pneumonia. °· You develop yellow or brown nasal discharge or pain in the face, especially when you bend forward. These may be signs of sinusitis. °· You develop a fever, swollen neck glands, pain with swallowing, or white areas in the back of your throat. These may be signs of strep throat. °SEEK IMMEDIATE MEDICAL CARE IF:  °· You have a fever. °· You develop severe or persistent headache, ear pain, sinus pain, or chest pain. °· You develop wheezing, a prolonged cough, cough up blood, or have a change in your usual mucus (if you have chronic lung disease). °· You develop sore muscles or a stiff neck. °Document Released: 05/12/2001 Document Revised: 02/08/2012 Document Reviewed: 02/21/2014 °Exit[employer]® Patient Information ©2015 [employer]. This information is not intended to replace advice given to you by your health care provider. Make sure you discuss any questions you have with your health care provider. ° °Asthma, Acute Bronchospasm °Acute bronchospasm caused by asthma is also referred to as an asthma attack. Bronchospasm means your air passages become narrowed. The narrowing is caused by inflammation and tightening of the muscles in the air tubes (bronchi)  in your lungs. This can make it hard to breathe or cause you to wheeze and cough. °CAUSES °Possible triggers are: °· Animal dander from the skin, hair, or feathers of animals. °· Dust mites contained in house dust. °· Cockroaches. °· Pollen from trees or grass. °· Mold. °· Cigarette or tobacco smoke. °· Air pollutants such as dust, household cleaners, hair sprays, aerosol sprays, paint fumes, strong chemicals, or strong odors. °· Cold air or weather changes. Cold air may trigger inflammation. Winds increase molds and pollens in the air. °· Strong emotions such as crying or laughing hard. °· Stress. °· Certain medicines such as aspirin or beta-blockers. °· Sulfites in foods and drinks, such as dried fruits and wine. °· Infections or inflammatory conditions, such as a flu, cold, or inflammation of the nasal membranes (rhinitis). °· Gastroesophageal reflux disease (GERD). GERD is a condition where stomach acid backs up into your esophagus. °· Exercise or strenuous activity. °SIGNS AND SYMPTOMS  °· Wheezing. °· Excessive coughing, particularly at night. °· Chest tightness. °· Shortness of breath. °DIAGNOSIS  °Your health care provider will ask you about your medical history and perform a physical exam. A chest X-ray or blood testing may be performed to look for other causes of your symptoms or other conditions that may have triggered your asthma attack.  °TREATMENT  °Treatment is aimed   at reducing inflammation and opening up the airways in your lungs.  Most asthma attacks are treated with inhaled medicines. These include quick relief or rescue medicines (such as bronchodilators) and controller medicines (such as inhaled corticosteroids). These medicines are sometimes given through an inhaler or a nebulizer. Systemic steroid medicine taken by mouth or given through an IV tube also can be used to reduce the inflammation when an attack is moderate or severe. Antibiotic medicines are only used if a bacterial infection is  present.  °HOME CARE INSTRUCTIONS  °· Rest. °· Drink plenty of liquids. This helps the mucus to remain thin and be easily coughed up. Only use caffeine in moderation and do not use alcohol until you have recovered from your illness. °· Do not smoke. Avoid being exposed to secondhand smoke. °· You play a critical role in keeping yourself in good health. Avoid exposure to things that cause you to wheeze or to have breathing problems. °· Keep your medicines up-to-date and available. Carefully follow your health care provider's treatment plan. °· Take your medicine exactly as prescribed. °· When pollen or pollution is bad, keep windows closed and use an air conditioner or go to places with air conditioning. °· Asthma requires careful medical care. See your health care provider for a follow-up as advised. If you are more than [redacted] weeks pregnant and you were prescribed any new medicines, let your obstetrician know about the visit and how you are doing. Follow up with your health care provider as directed. °· After you have recovered from your asthma attack, make an appointment with your outpatient doctor to talk about ways to reduce the likelihood of future attacks. If you do not have a doctor who manages your asthma, make an appointment with a primary care doctor to discuss your asthma. °SEEK IMMEDIATE MEDICAL CARE IF:  °· You are getting worse. °· You have trouble breathing. If severe, call your local emergency services (911 in the [place] °· You develop chest pain or discomfort. °· You are vomiting. °· You are not able to keep fluids down. °· You are coughing up yellow, green, brown, or bloody sputum. °· You have a fever and your symptoms suddenly get worse. °· You have trouble swallowing. °MAKE SURE YOU:  °· Understand these instructions. °· Will watch your condition. °· Will get help right away if you are not doing well or get worse. °Document Released: 03/03/2007 Document Revised: 11/21/2013 Document Reviewed:  05/24/2013 °ExitCare® Patient Information ©2015 [employer]. This information is not intended to replace advice given to you by your health care provider. Make sure you discuss any questions you have with your health care provider. ° °

## 2014-12-06 LAB — CULTURE, GROUP A STREP

## 2015-01-07 ENCOUNTER — Emergency Department (HOSPITAL_COMMUNITY): Payer: Medicaid Other

## 2015-01-07 ENCOUNTER — Emergency Department (HOSPITAL_COMMUNITY)
Admission: EM | Admit: 2015-01-07 | Discharge: 2015-01-08 | Disposition: A | Discharge status: Home / Self Care | Payer: Medicaid Other | Attending: Emergency Medicine | Admitting: Emergency Medicine

## 2015-01-07 ENCOUNTER — Encounter (HOSPITAL_COMMUNITY): Payer: Self-pay | Admitting: Emergency Medicine

## 2015-01-07 DIAGNOSIS — Z8719 Personal history of other diseases of the digestive system: Secondary | ICD-10-CM | POA: Diagnosis not present

## 2015-01-07 DIAGNOSIS — I878 Other specified disorders of veins: Secondary | ICD-10-CM | POA: Diagnosis not present

## 2015-01-07 DIAGNOSIS — Z862 Personal history of diseases of the blood and blood-forming organs and certain disorders involving the immune mechanism: Secondary | ICD-10-CM | POA: Insufficient documentation

## 2015-01-07 DIAGNOSIS — Z720 Tobacco use: Secondary | ICD-10-CM | POA: Diagnosis not present

## 2015-01-07 DIAGNOSIS — Z7952 Long term (current) use of systemic steroids: Secondary | ICD-10-CM | POA: Diagnosis not present

## 2015-01-07 DIAGNOSIS — L0291 Cutaneous abscess, unspecified: Secondary | ICD-10-CM

## 2015-01-07 DIAGNOSIS — Z79899 Other long term (current) drug therapy: Secondary | ICD-10-CM | POA: Insufficient documentation

## 2015-01-07 DIAGNOSIS — J45909 Unspecified asthma, uncomplicated: Secondary | ICD-10-CM | POA: Diagnosis not present

## 2015-01-07 DIAGNOSIS — M7989 Other specified soft tissue disorders: Secondary | ICD-10-CM | POA: Diagnosis present

## 2015-01-07 NOTE — ED Notes (Signed)
Pt reports that she has been having a "bump" in her LAC for the past "few months" and for the last month it has grown in size and has become more painful. Pt states that she has had numbness with the increase in pain "it makes me drop things." Pt also states that her R shoulder started hurting at that same time "and will give out." Pt denies any known injuries

## 2015-01-07 NOTE — ED Provider Notes (Signed)
CSN: [id number]     Arrival date & time 01/07/15  2251 History  This chart was scribed for non-physician practitioner, Sabino Dick, NP-C working with Raeford Razor, MD by Luisa Dago, ED scribe. This patient was seen in room WTR6/WTR6 and the patient's care was started at 11:21 PM.    Chief Complaint  Patient presents with  . Abscess   The history is provided by the patient and medical records. No language interpreter was used.   HPI Comments: Tiffany [name] is a 24 y.o. female who presents to the Emergency Department complaining of a cyst to her LAC that she first noted 1 month ago, but that started worsening 2-3 days ago. She endorses associated swelling and pain to the area. No recent injuries or falls. Pt denies any fever, neck pain, sore throat, visual disturbance, CP, cough, SOB, abdominal pain, nausea, emesis, diarrhea, urinary symptoms, back pain, HA, weakness, numbness and rash as associated symptoms.     firsm pea sized nodule that is mobile  Past Medical History  Diagnosis Date  . Asthma   . Anemia   . GERD (gastroesophageal reflux disease)   . Shortness of breath    Past Surgical History  Procedure Laterality Date  . Wisdom tooth extraction  2009   Family History  Problem Relation Age of Onset  . Other Neg Hx   . Alcohol abuse Neg Hx   . Arthritis Neg Hx   . Birth defects Neg Hx   . Cancer Neg Hx   . COPD Neg Hx   . Depression Neg Hx   . Diabetes Neg Hx   . Drug abuse Neg Hx   . Early death Neg Hx   . Hearing loss Neg Hx   . Heart disease Neg Hx   . Hyperlipidemia Neg Hx   . Hypertension Neg Hx   . Kidney disease Neg Hx   . Learning disabilities Neg Hx   . Mental illness Neg Hx   . Mental retardation Neg Hx   . Miscarriages / Stillbirths Neg Hx   . Stroke Neg Hx   . Vision loss Neg Hx   . Asthma Sister   . Asthma Brother   . Asthma Other   . Asthma Other    History  Substance Use Topics  . Smoking status: Current Every Day Smoker -- 0.10  packs/day for 1 years    Types: Cigars, Cigarettes  . Smokeless tobacco: Never Used  . Alcohol Use: Yes     Comment: RARELY   OB History    Gravida Para Term Preterm AB TAB SAB Ectopic Multiple Living   0 0 0 0 0 0 1     Review of Systems  Musculoskeletal: Negative for joint swelling.  Skin: Negative for color change, rash and wound.      Allergies  Review of patient's allergies indicates no known allergies.  Home Medications   Prior to Admission medications   Medication Sig Start Date End Date Taking? Authorizing Provider  albuterol (PROVENTIL HFA;VENTOLIN HFA) 108 (90 BASE) MCG/ACT inhaler Inhale 1-2 puffs into the lungs every 6 (six) hours as needed for wheezing or shortness of breath.   Yes Historical Provider, MD  cyclobenzaprine (FLEXERIL) 10 MG tablet Take 1 tablet (10 mg total) by mouth 2 (two) times daily as needed for muscle spasms. Patient not taking: Reported on 01/07/2015 08/22/14   Monte Fantasia, PA-C  HYDROcodone-acetaminophen (NORCO/VICODIN) 5-325 MG per tablet Take 1-2 tablets by mouth  every 6 (six) hours as needed for moderate pain or severe pain. Patient not taking: Reported on 01/07/2015 08/22/14   Monte FantasiaJoseph W Mintz, PA-C  predniSONE (DELTASONE) 20 MG tablet Take 2 tablets (40 mg total) by mouth daily. Patient not taking: Reported on 01/07/2015 12/05/14   Monte FantasiaJoseph W Mintz, PA-C   BP 125/75 mmHg  Pulse 76  Temp(Src) 98.6 F (37 C) (Oral)  Resp 16  SpO2 99%  LMP 12/31/2014 (Approximate)  Physical Exam  Constitutional: She is oriented to person, place, and time. She appears well-developed and well-nourished.  HENT:  Head: Normocephalic and atraumatic.  Cardiovascular: Normal rate.   Pulmonary/Chest: Effort normal.  Neurological: She is alert and oriented to person, place, and time.  Skin: Skin is warm and dry.  Firm small pea sized nodule to left antecubital that is mobile.   Psychiatric: She has a normal mood and affect.  Nursing note and vitals  reviewed.   ED Course  Procedures (including critical care time)  DIAGNOSTIC STUDIES: Oxygen Saturation is 99% on RA, normal by my interpretation.    COORDINATION OF CARE: 11:24 PM- Pt advised of plan for treatment and pt agrees.  Labs Review Labs Reviewed - No data to display  Imaging Review Dg Forearm Left  01/08/2015   CLINICAL DATA:  Left forearm and elbow pain. Growing palpable bump for a few months. Now painful. Numbness and increased pain.  EXAM: LEFT FOREARM - 2 VIEW  COMPARISON:  None.  FINDINGS: Small rounded calcification in the soft tissues over the left antecubital fossa probably representing a phlebolith. Soft tissues are otherwise unremarkable. No evidence of acute fracture or subluxation. No focal bone lesion or bone destruction. Bone cortex and trabecular architecture appear intact. No radiopaque soft tissue foreign bodies.  IMPRESSION: Soft tissue calcification in the left antecubital fossa consistent with phlebolith. No acute bony abnormalities.   Electronically Signed   By: Burman NievesWilliam  Stevens M.D.   On: 01/08/2015 00:02     EKG Interpretation None     Patient has a small phlebolith in the left antecubital fossa  [facility] if it continues to bother her MDM   Final diagnoses:  Phlebolith   I personally performed the services described in this documentation, which was scribed in my presence. The recorded information has been reviewed and is accurate.   Arman FilterGail K Aspasia Rude, NP 01/08/15 [id number]  Raeford RazorStephen Kohut, MD 01/10/15 1010

## 2015-01-08 NOTE — Discharge Instructions (Signed)
Phlebolith-This is a small local calcification within the walls of a vein and is usually rounded. They are sometimes referred to as a vein stone. A calcification is when there is an accumulation of calcium salts in the body tissue and usually happens in the formation of bone but can be abnormally deposited in the soft tissue and harden

## 2015-02-18 ENCOUNTER — Other Ambulatory Visit: Payer: Self-pay | Admitting: *Deleted

## 2015-02-21 ENCOUNTER — Ambulatory Visit: Payer: Self-pay | Admitting: Surgery

## 2015-02-21 NOTE — H&P (Signed)
History of Present Illness Tiffany [name](Tiffany [name] K. Tiffany Newmann MD; 02/21/2015 9:59 AM) Patient words: lipoma left arm.  The patient is a 24 year old female who presents with a complaint of Mass. Referred by [facility] for evaluation of palpable mass in left antecubital fossa This is a 24 yo female who presents with a two month history of a palpable mass in her left antecubital fossa. This occasionally swells up and becomes tender, and then resolves. It has not become reddened or infected. Unfortunately, she went to the ED for evaluation of this problem and they palpated a very small firm mobile mass in her left AC fossa. X-ray confirmed a small rounded calcification in the soft tissues that probably represents a phlebolith. She presents now for surgical evaluation for excision.   Other Problems Gilmer Mor(Sonya Bynum, CMA; 02/19/2015 2:05 PM) Anxiety Disorder Asthma Depression  Past Surgical History Gilmer Mor(Sonya Bynum, CMA; 02/19/2015 2:05 PM) Oral Surgery  Diagnostic Studies History Gilmer Mor(Sonya Bynum, CMA; 02/19/2015 2:05 PM) Colonoscopy never Mammogram never Pap Smear 1-5 years ago  Allergies Gilmer Mor(Sonya Bynum, CMA; 02/19/2015 2:06 PM) No Known Drug Allergies03/22/2016  Medication History (Sonya Bynum, CMA; 02/19/2015 2:07 PM) Albuterol Sulfate (108 (90 Base)MCG/ACT Aero Pow Br Act, Inhalation) Active. Medications Reconciled  Social History Gilmer Mor(Sonya Bynum, CMA; 02/19/2015 2:05 PM) Alcohol use Occasional alcohol use. Caffeine use Carbonated beverages. No drug use Tobacco use Current every day smoker.  Family History Gilmer Mor(Sonya Bynum, CMA; 02/19/2015 2:05 PM) First Degree Relatives No pertinent family history  Pregnancy / Birth History Gilmer Mor(Sonya Bynum, CMA; 02/19/2015 2:05 PM) Age at menarche 15 years. Gravida 1 Irregular periods Maternal age 23-25 Para 1  Review of Systems Lamar Laundry(Sonya Bynum CMA; 02/19/2015 2:05 PM) Skin Not Present- Change in Wart/Mole, Dryness, Hives, Jaundice, New  Lesions, Non-Healing Wounds, Rash and Ulcer. HEENT Present- Wears glasses/contact lenses. Not Present- Earache, Hearing Loss, Hoarseness, Nose Bleed, Oral Ulcers, Ringing in the Ears, Seasonal Allergies, Sinus Pain, Sore Throat, Visual Disturbances and Yellow Eyes. Respiratory Present- Difficulty Breathing and Wheezing. Not Present- Bloody sputum, Chronic Cough and Snoring. Cardiovascular Present- Shortness of Breath. Not Present- Chest Pain, Difficulty Breathing Lying Down, Leg Cramps, Palpitations, Rapid Heart Rate and Swelling of Extremities. Gastrointestinal Not Present- Abdominal Pain, Bloating, Bloody Stool, Change in Bowel Habits, Chronic diarrhea, Constipation, Difficulty Swallowing, Excessive gas, Gets full quickly at meals, Hemorrhoids, Indigestion, Nausea, Rectal Pain and Vomiting. Female Genitourinary Not Present- Frequency, Nocturia, Painful Urination, Pelvic Pain and Urgency. Musculoskeletal Present- Back Pain, Muscle Pain and Muscle Weakness. Not Present- Joint Pain, Joint Stiffness and Swelling of Extremities. Neurological Present- Headaches. Not Present- Decreased Memory, Fainting, Numbness, Seizures, Tingling, Tremor, Trouble walking and Weakness. Psychiatric Present- Anxiety. Not Present- Bipolar, Change in Sleep Pattern, Depression, Fearful and Frequent crying. Endocrine Present- Hot flashes. Not Present- Cold Intolerance, Excessive Hunger, Hair Changes, Heat Intolerance and New Diabetes.   Vitals (Sonya Bynum CMA; 02/19/2015 2:06 PM) 02/19/2015 2:06 PM Weight: 175 lb Height: 64in Body Surface Area: 1.89 m Body Mass Index: 30.04 kg/m Temp.: 97.19F(Temporal)  Pulse: 79 (Regular)  BP: 136/84 (Sitting, Left Arm, Standard)    Physical Exam [name](Naryah Clenney K. Rhiannan Kievit MD; 02/21/2015 9:59 AM) The physical exam findings are as follows: Note:WDWN in NAD Left arm just above crease in antecubital fossa - 7 mm firm palpable subcutaneous mass with some surrounding ecchymosis. No  sign of infection or inflammation.    Assessment & Plan Tiffany [name](Tiffany [name] K. Raneem Mendolia MD; 02/19/2015 2:38 PM) PHLEBOLITH (459.89  I87.8) Current Plans  Schedule for Surgery - Excision of  phlebolith - left antecubital fossa. The surgical procedure has been discussed with the patient. Potential risks, benefits, alternative treatments, and expected outcomes have been explained. All of the patient's questions at this time have been answered. The likelihood of reaching the patient's treatment goal is good. The patient understand the proposed surgical procedure and wishes to proceed.    Tiffany [name]. [name], MD, [facility]  General/ Trauma Surgery  02/21/2015 10:01 AM

## 2015-03-11 ENCOUNTER — Encounter (HOSPITAL_COMMUNITY): Payer: Self-pay

## 2015-03-11 ENCOUNTER — Encounter (HOSPITAL_COMMUNITY)
Admission: RE | Admit: 2015-03-11 | Discharge: 2015-03-11 | Disposition: A | Discharge status: Home / Self Care | Payer: Medicaid Other | Source: Ambulatory Visit | Attending: Surgery | Admitting: Surgery

## 2015-03-11 DIAGNOSIS — Z01812 Encounter for preprocedural laboratory examination: Secondary | ICD-10-CM | POA: Insufficient documentation

## 2015-03-11 DIAGNOSIS — Z01810 Encounter for preprocedural cardiovascular examination: Secondary | ICD-10-CM | POA: Diagnosis not present

## 2015-03-11 HISTORY — DX: Insomnia, unspecified: G47.00

## 2015-03-11 HISTORY — DX: Anxiety disorder, unspecified: F41.9

## 2015-03-11 HISTORY — DX: Personal history of other diseases of the nervous system and sense organs: Z86.69

## 2015-03-11 HISTORY — DX: Reserved for inherently not codable concepts without codable children: IMO0001

## 2015-03-11 LAB — CBC
HEMATOCRIT: 39.9 % (ref 36.0–46.0)
HEMOGLOBIN: 13.8 g/dL (ref 12.0–15.0)
MCH: 31.4 pg (ref 26.0–34.0)
MCHC: 34.6 g/dL (ref 30.0–36.0)
MCV: 90.9 fL (ref 78.0–100.0)
Platelets: 223 10*3/uL (ref 150–400)
RBC: 4.39 MIL/uL (ref 3.87–5.11)
RDW: 13 % (ref 11.5–15.5)
WBC: 10.4 10*3/uL (ref 4.0–10.5)

## 2015-03-11 LAB — HCG, SERUM, QUALITATIVE: PREG SERUM: NEGATIVE

## 2015-03-11 MED ORDER — CHLORHEXIDINE GLUCONATE 4 % EX LIQD: 1.0000 "application " | Freq: Once | CUTANEOUS | Status: DC

## 2015-03-11 NOTE — Progress Notes (Signed)
CXR in epic from 12-05-14  Medical Md is Dr.

## 2015-03-11 NOTE — Pre-Procedure Instructions (Signed)
Rich Reiningatisha R Rhatigan  03/11/2015   Your procedure is scheduled on:  Thurs, April 14 @ 11:30 AM  [facility].  Call this number if you have problems the morning of surgery: [phone]   Remember:   Do not eat food or drink liquids after midnight.   Take these medicines the morning of surgery with A SIP OF WATER: Albuterol<Bring Your Inhaler With You> and Pain Pill(if needed)   Do not wear jewelry, make-up or nail polish.  Do not wear lotions, powders, or perfumes. You may wear deodorant.  Do not shave 48 hours prior to surgery.   Do not bring valuables to the hospital.  [facility] is not responsible                  for any belongings or valuables.               Contacts, dentures or bridgework may not be worn into surgery.  Leave suitcase in the car. After surgery it may be brought to your room.  For patients admitted to the hospital, discharge time is determined by your                treatment team.               Patients discharged the day of surgery will not be allowed to drive  home.    Special Instructions:  [place] - Preparing for Surgery  Before surgery, you can play an important role.  Because skin is not sterile, your skin needs to be as free of germs as possible.  You can reduce the number of germs on you skin by washing with CHG (chlorahexidine gluconate) soap before surgery.  CHG is an antiseptic cleaner which kills germs and bonds with the skin to continue killing germs even after washing.  Please DO NOT use if you have an allergy to CHG or antibacterial soaps.  If your skin becomes reddened/irritated stop using the CHG and inform your nurse when you arrive at Short Stay.  Do not shave (including legs and underarms) for at least 48 hours prior to the first CHG shower.  You may shave your face.  Please follow these instructions carefully:   1.  Shower with CHG Soap the night before surgery and the                                morning  of Surgery.  2.  If you choose to wash your hair, wash your hair first as usual with your       normal shampoo.  3.  After you shampoo, rinse your hair and body thoroughly to remove the                      Shampoo.  4.  Use CHG as you would any other liquid soap.  You can apply chg directly       to the skin and wash gently with scrungie or a clean washcloth.  5.  Apply the CHG Soap to your body ONLY FROM THE NECK DOWN.        Do not use on open wounds or open sores.  Avoid contact with your eyes,       ears, mouth and genitals (private parts).  Wash genitals (private parts)       with your  normal soap.  6.  Wash thoroughly, paying special attention to the area where your surgery        will be performed.  7.  Thoroughly rinse your body with warm water from the neck down.  8.  DO NOT shower/wash with your normal soap after using and rinsing off       the CHG Soap.  9.  Pat yourself dry with a clean towel.            10.  Wear clean pajamas.            11.  Place clean sheets on your bed the night of your first shower and do not        sleep with pets.  Day of Surgery  Do not apply any lotions/deoderants the morning of surgery.  Please wear clean clothes to the hospital/surgery center.     Please read over the following fact sheets that you were given: Pain Booklet, Coughing and Deep Breathing and Surgical Site Infection Prevention

## 2015-03-14 ENCOUNTER — Ambulatory Visit (HOSPITAL_COMMUNITY): Payer: Medicaid Other | Admitting: Anesthesiology

## 2015-03-14 ENCOUNTER — Encounter (HOSPITAL_COMMUNITY): Payer: Self-pay | Admitting: Surgery

## 2015-03-14 ENCOUNTER — Encounter (HOSPITAL_COMMUNITY)
Admission: RE | Disposition: A | Discharge status: Home / Self Care | Payer: Self-pay | Source: Ambulatory Visit | Attending: Surgery

## 2015-03-14 ENCOUNTER — Ambulatory Visit (HOSPITAL_COMMUNITY)
Admission: RE | Admit: 2015-03-14 | Discharge: 2015-03-14 | Disposition: A | Discharge status: Home / Self Care | Payer: Medicaid Other | Source: Ambulatory Visit | Attending: Surgery | Admitting: Surgery

## 2015-03-14 DIAGNOSIS — Z79899 Other long term (current) drug therapy: Secondary | ICD-10-CM | POA: Insufficient documentation

## 2015-03-14 DIAGNOSIS — F17200 Nicotine dependence, unspecified, uncomplicated: Secondary | ICD-10-CM | POA: Diagnosis not present

## 2015-03-14 DIAGNOSIS — I82612 Acute embolism and thrombosis of superficial veins of left upper extremity: Secondary | ICD-10-CM | POA: Diagnosis not present

## 2015-03-14 DIAGNOSIS — F1910 Other psychoactive substance abuse, uncomplicated: Secondary | ICD-10-CM | POA: Insufficient documentation

## 2015-03-14 DIAGNOSIS — I878 Other specified disorders of veins: Secondary | ICD-10-CM | POA: Diagnosis present

## 2015-03-14 DIAGNOSIS — J45909 Unspecified asthma, uncomplicated: Secondary | ICD-10-CM | POA: Insufficient documentation

## 2015-03-14 HISTORY — PX: LIPOMA EXCISION: SHX5283

## 2015-03-14 SURGERY — EXCISION LIPOMA
Anesthesia: Monitor Anesthesia Care | Site: Arm Upper | Laterality: Left

## 2015-03-14 MED ORDER — MIDAZOLAM HCL 5 MG/5ML IJ SOLN
INTRAMUSCULAR | Status: DC | PRN
Start: 2015-03-14 — End: 2015-03-14
  Administered 2015-03-14 (×2): 1 mg via INTRAVENOUS

## 2015-03-14 MED ORDER — ACETAMINOPHEN 160 MG/5ML PO SOLN: 325.0000 mg | ORAL | Status: DC | PRN

## 2015-03-14 MED ORDER — PROPOFOL 10 MG/ML IV BOLUS
INTRAVENOUS | Status: DC | PRN
  Administered 2015-03-14 (×4): 20 mg via INTRAVENOUS
  Administered 2015-03-14: 10 mg via INTRAVENOUS

## 2015-03-14 MED ORDER — BUPIVACAINE-EPINEPHRINE 0.25% -1:200000 IJ SOLN
INTRAMUSCULAR | Status: DC | PRN
  Administered 2015-03-14: 2 mL

## 2015-03-14 MED ORDER — PROPOFOL 10 MG/ML IV BOLUS
INTRAVENOUS | Status: AC
  Filled 2015-03-14: qty 20

## 2015-03-14 MED ORDER — HYDROMORPHONE HCL 1 MG/ML IJ SOLN
INTRAMUSCULAR | Status: AC
  Filled 2015-03-14: qty 1

## 2015-03-14 MED ORDER — HYDROMORPHONE HCL 1 MG/ML IJ SOLN
0.2500 mg | INTRAMUSCULAR | Status: DC | PRN
  Administered 2015-03-14: 0.25 mg via INTRAVENOUS

## 2015-03-14 MED ORDER — FENTANYL CITRATE 0.05 MG/ML IJ SOLN
INTRAMUSCULAR | Status: AC
  Filled 2015-03-14: qty 5

## 2015-03-14 MED ORDER — LACTATED RINGERS IV SOLN
INTRAVENOUS | Status: DC
  Administered 2015-03-14: 10:00:00 via INTRAVENOUS

## 2015-03-14 MED ORDER — OXYCODONE HCL 5 MG PO TABS
ORAL_TABLET | ORAL | Status: AC
  Filled 2015-03-14: qty 1

## 2015-03-14 MED ORDER — FENTANYL CITRATE 0.05 MG/ML IJ SOLN
INTRAMUSCULAR | Status: DC | PRN
  Administered 2015-03-14 (×2): 25 ug via INTRAVENOUS
  Administered 2015-03-14 (×2): 50 ug via INTRAVENOUS

## 2015-03-14 MED ORDER — ROCURONIUM BROMIDE 50 MG/5ML IV SOLN
INTRAVENOUS | Status: AC
  Filled 2015-03-14: qty 1

## 2015-03-14 MED ORDER — MORPHINE SULFATE 2 MG/ML IJ SOLN: 2.0000 mg | INTRAMUSCULAR | Status: DC | PRN

## 2015-03-14 MED ORDER — MIDAZOLAM HCL 2 MG/2ML IJ SOLN
INTRAMUSCULAR | Status: AC
  Filled 2015-03-14: qty 2

## 2015-03-14 MED ORDER — LIDOCAINE HCL (CARDIAC) 20 MG/ML IV SOLN
INTRAVENOUS | Status: AC
  Filled 2015-03-14: qty 5

## 2015-03-14 MED ORDER — ACETAMINOPHEN 325 MG PO TABS: 325.0000 mg | ORAL_TABLET | ORAL | Status: DC | PRN

## 2015-03-14 MED ORDER — OXYCODONE HCL 5 MG PO TABS
5.0000 mg | ORAL_TABLET | Freq: Once | ORAL | Status: AC | PRN
  Administered 2015-03-14: 5 mg via ORAL

## 2015-03-14 MED ORDER — OXYCODONE HCL 5 MG/5ML PO SOLN: 5.0000 mg | Freq: Once | ORAL | Status: AC | PRN

## 2015-03-14 MED ORDER — 0.9 % SODIUM CHLORIDE (POUR BTL) OPTIME
TOPICAL | Status: DC | PRN
  Administered 2015-03-14: 1000 mL

## 2015-03-14 MED ORDER — ONDANSETRON HCL 4 MG/2ML IJ SOLN
4.0000 mg | INTRAMUSCULAR | Status: DC | PRN
  Filled 2015-03-14: qty 2

## 2015-03-14 MED ORDER — LACTATED RINGERS IV SOLN
INTRAVENOUS | Status: DC | PRN
  Administered 2015-03-14: 11:00:00 via INTRAVENOUS

## 2015-03-14 MED ORDER — HYDROCODONE-ACETAMINOPHEN 5-325 MG PO TABS: 1.0000 | ORAL_TABLET | ORAL | Status: DC | PRN

## 2015-03-14 SURGICAL SUPPLY — 46 items
BENZOIN TINCTURE PRP APPL 2/3 (GAUZE/BANDAGES/DRESSINGS) ×3 IMPLANT
BLADE SURG 10 STRL SS (BLADE) ×3 IMPLANT
BLADE SURG 15 STRL LF DISP TIS (BLADE) ×1 IMPLANT
BLADE SURG 15 STRL SS (BLADE) ×2
BLADE SURG ROTATE 9660 (MISCELLANEOUS) IMPLANT
CHLORAPREP W/TINT 26ML (MISCELLANEOUS) ×3 IMPLANT
CLOSURE WOUND 1/2 X4 (GAUZE/BANDAGES/DRESSINGS) ×1
COVER SURGICAL LIGHT HANDLE (MISCELLANEOUS) ×3 IMPLANT
DRAPE LAPAROTOMY T 98X78 PEDS (DRAPES) ×3 IMPLANT
DRAPE UTILITY XL STRL (DRAPES) ×3 IMPLANT
DRSG TEGADERM 2-3/8X2-3/4 SM (GAUZE/BANDAGES/DRESSINGS) ×3 IMPLANT
ELECT CAUTERY BLADE 6.4 (BLADE) ×3 IMPLANT
ELECT REM PT RETURN 9FT ADLT (ELECTROSURGICAL) ×3
ELECTRODE REM PT RTRN 9FT ADLT (ELECTROSURGICAL) ×1 IMPLANT
GAUZE SPONGE 2X2 8PLY STRL LF (GAUZE/BANDAGES/DRESSINGS) ×1 IMPLANT
GAUZE SPONGE 4X4 12PLY STRL (GAUZE/BANDAGES/DRESSINGS) ×3 IMPLANT
GLOVE BIO SURGEON STRL SZ 6.5 (GLOVE) ×2 IMPLANT
GLOVE BIO SURGEON STRL SZ7 (GLOVE) ×3 IMPLANT
GLOVE BIO SURGEONS STRL SZ 6.5 (GLOVE) ×1
GLOVE BIOGEL PI IND STRL 7.0 (GLOVE) ×1 IMPLANT
GLOVE BIOGEL PI IND STRL 7.5 (GLOVE) ×1 IMPLANT
GLOVE BIOGEL PI INDICATOR 7.0 (GLOVE) ×2
GLOVE BIOGEL PI INDICATOR 7.5 (GLOVE) ×2
GOWN STRL REUS W/ TWL LRG LVL3 (GOWN DISPOSABLE) ×2 IMPLANT
GOWN STRL REUS W/TWL LRG LVL3 (GOWN DISPOSABLE) ×4
KIT BASIN OR (CUSTOM PROCEDURE TRAY) ×3 IMPLANT
KIT ROOM TURNOVER OR (KITS) ×3 IMPLANT
NEEDLE HYPO 25GX1X1/2 BEV (NEEDLE) ×3 IMPLANT
NS IRRIG 1000ML POUR BTL (IV SOLUTION) ×3 IMPLANT
PACK SURGICAL SETUP 50X90 (CUSTOM PROCEDURE TRAY) ×3 IMPLANT
PAD ARMBOARD 7.5X6 YLW CONV (MISCELLANEOUS) ×3 IMPLANT
PENCIL BUTTON HOLSTER BLD 10FT (ELECTRODE) ×3 IMPLANT
SPECIMEN JAR SMALL (MISCELLANEOUS) ×3 IMPLANT
SPONGE GAUZE 2X2 STER 10/PKG (GAUZE/BANDAGES/DRESSINGS) ×2
SPONGE LAP 18X18 X RAY DECT (DISPOSABLE) ×3 IMPLANT
STRIP CLOSURE SKIN 1/2X4 (GAUZE/BANDAGES/DRESSINGS) ×2 IMPLANT
SUT MNCRL AB 4-0 PS2 18 (SUTURE) ×3 IMPLANT
SUT VIC AB 2-0 SH 27 (SUTURE)
SUT VIC AB 2-0 SH 27X BRD (SUTURE) IMPLANT
SUT VIC AB 3-0 SH 27 (SUTURE) ×2
SUT VIC AB 3-0 SH 27XBRD (SUTURE) ×1 IMPLANT
SUT VICRYL AB 3 0 TIES (SUTURE) ×3 IMPLANT
SYR BULB 3OZ (MISCELLANEOUS) ×3 IMPLANT
SYR CONTROL 10ML LL (SYRINGE) ×3 IMPLANT
TOWEL OR 17X24 6PK STRL BLUE (TOWEL DISPOSABLE) ×3 IMPLANT
TOWEL OR 17X26 10 PK STRL BLUE (TOWEL DISPOSABLE) ×3 IMPLANT

## 2015-03-14 NOTE — Discharge Instructions (Signed)
Ice as needed for the first few days Remove outer dressing on Saturday. You may then shower over the steri-strips until they come off on their own in 1-2 weeks.

## 2015-03-14 NOTE — Transfer of Care (Signed)
Immediate Anesthesia Transfer of Care Note  Patient: Tiffany [name]  Procedure(s) Performed: Procedure(s): EXCISION OF LEFT ANTECUBITAL PHLEBOLITH (Left)  Patient Location: PACU  Anesthesia Type:MAC  Level of Consciousness: awake, alert , oriented and patient cooperative  Airway & Oxygen Therapy: Patient Spontanous Breathing and Patient connected to nasal cannula oxygen  Post-op Assessment: Report given to RN, Post -op Vital signs reviewed and stable and Patient moving all extremities  Post vital signs: Reviewed and stable  Last Vitals:  Filed Vitals:   03/14/15 0946  BP:   Pulse:   Temp: 37.7 C  Resp:     Complications: No apparent anesthesia complications

## 2015-03-14 NOTE — Op Note (Signed)
Pre-op Diagnosis - phlebolith - left antecubital Post-op Diagnosis: - same Procedure:  Excision of phlebolith - left antecubital vein Surgeon:  Stephone Gum K. Anesthesia:  Local MAC Indications:  24 yo female presents with a hard palpable mass in her left antecubital fossa that occasionally causes swelling and tenderness.  Phlebolith was diagnosed on x-ray.  She presents for excision.  Description of procedure:  The patient was brought to the operating room and placed in a supine position on the operating table.  She was given some IV sedation and her left arm was prepped with Chloraprep and draped in sterile fashion.  The area over the palpable mass was anesthetized with 0.25% marcaine.  A 1 cm incision was made over the mass and dissection was carried down through the subcutaneous tissue.  The antecubital vein was identified and a hard calcified mass was seen within the vein.  The vein was ligated with 3-0 vicryl ties above and below the mass and the segment of vein was excised.  The wound was closed with 3-0 vicryl and 4-0 monocryl.  Steri-strips and clean dressings were applied.  The vein was sent for pathology.  Counts were correct.  Tiffany ArmsMatthew K. Corliss Skainssuei, MD, [facility] [place] [facility]  General/ Trauma Surgery  03/14/2015 11:55 AM

## 2015-03-14 NOTE — Progress Notes (Signed)
Attempt to waste un-used Dilaudid from PACU. Patient no longer available in PIXIS. Wasted IV DIlaudid 0.75 mg (0.75 ml) WItnessed by Dahlia Bailiffobin Roberts, RN

## 2015-03-14 NOTE — Anesthesia Procedure Notes (Signed)
Procedure Name: MAC Date/Time: 03/14/2015 11:10 AM Performed by: Leonel Ramsay'LAUGHLIN, Ky Rumple H Pre-anesthesia Checklist: Patient identified, Timeout performed, Emergency Drugs available, Suction available and Patient being monitored Patient Re-evaluated:Patient Re-evaluated prior to inductionOxygen Delivery Method: Nasal cannula Dental Injury: Teeth and Oropharynx as per pre-operative assessment

## 2015-03-14 NOTE — Anesthesia Preprocedure Evaluation (Addendum)
Anesthesia Evaluation  Patient identified by MRN, date of birth, ID band Patient awake    Reviewed: Allergy & Precautions, NPO status , Patient's Chart, lab work & pertinent test results  History of Anesthesia Complications Negative for: history of anesthetic complications  Airway Mallampati: II  TM Distance: >3 FB Neck ROM: Full    Dental  (+) Teeth Intact   Pulmonary shortness of breath, asthma , Current Smoker,  breath sounds clear to auscultation        Cardiovascular negative cardio ROS  Rhythm:Regular     Neuro/Psych PSYCHIATRIC DISORDERS Anxiety negative neurological ROS     GI/Hepatic negative GI ROS, (+)     substance abuse  ,   Endo/Other  negative endocrine ROS  Renal/GU negative Renal ROS     Musculoskeletal   Abdominal   Peds  Hematology negative hematology ROS (+)   Anesthesia Other Findings   Reproductive/Obstetrics                            Anesthesia Physical Anesthesia Plan  ASA: II  Anesthesia Plan: MAC   Post-op Pain Management:    Induction: Intravenous  Airway Management Planned: Natural Airway  Additional Equipment: None  Intra-op Plan:   Post-operative Plan:   Informed Consent: I have reviewed the patients History and Physical, chart, labs and discussed the procedure including the risks, benefits and alternatives for the proposed anesthesia with the patient or authorized representative who has indicated his/her understanding and acceptance.   Dental advisory given  Plan Discussed with: CRNA and Surgeon  Anesthesia Plan Comments:         Anesthesia Quick Evaluation

## 2015-03-14 NOTE — Anesthesia Postprocedure Evaluation (Signed)
  Anesthesia Post-op Note  Patient: Tiffany [name]  Procedure(s) Performed: Procedure(s): EXCISION OF LEFT ANTECUBITAL PHLEBOLITH (Left)  Patient Location: PACU  Anesthesia Type:MAC  Level of Consciousness: awake and alert   Airway and Oxygen Therapy: Patient Spontanous Breathing  Post-op Pain: none  Post-op Assessment: Post-op Vital signs reviewed, Patient's Cardiovascular Status Stable, Respiratory Function Stable, Patent Airway, No signs of Nausea or vomiting and Pain level controlled  Post-op Vital Signs: Reviewed and stable  Last Vitals:  Filed Vitals:   03/14/15 1224  BP: 117/75  Pulse: 75  Temp:   Resp: 18    Complications: No apparent anesthesia complications

## 2015-03-14 NOTE — Interval H&P Note (Signed)
History and Physical Interval Note:  03/14/2015 10:33 AM  Tiffany ReiningNatisha R Kotarski  has presented today for surgery, with the diagnosis of Left Antecubital Phlebolith  The various methods of treatment have been discussed with the patient and family. After consideration of risks, benefits and other options for treatment, the patient has consented to  Procedure(s): EXCISION OF LEFT ANTECUBITAL PHLEBOLITH (Left) as a surgical intervention .  The patient's history has been reviewed, patient examined, no change in status, stable for surgery.  I have reviewed the patient's chart and labs.  Questions were answered to the patient's satisfaction.     Mikkel Charrette K.

## 2015-03-14 NOTE — H&P (View-Only) (Signed)
History of Present Illness Tiffany [name](Tiffany Belluomini K. Isamar Wellbrock MD; 02/21/2015 9:59 AM) Patient words: lipoma left arm.  The patient is a 24 year old female who presents with a complaint of Mass. Referred by [facility] for evaluation of palpable mass in left antecubital fossa This is a 24 yo female who presents with a two month history of a palpable mass in her left antecubital fossa. This occasionally swells up and becomes tender, and then resolves. It has not become reddened or infected. Unfortunately, she went to the ED for evaluation of this problem and they palpated a very small firm mobile mass in her left AC fossa. X-ray confirmed a small rounded calcification in the soft tissues that probably represents a phlebolith. She presents now for surgical evaluation for excision.   Other Problems Tiffany [name](Tiffany [name], CMA; 02/19/2015 2:05 PM) Anxiety Disorder Asthma Depression  Past Surgical History Tiffany [name](Tiffany [name], CMA; 02/19/2015 2:05 PM) Oral Surgery  Diagnostic Studies History Tiffany [name](Tiffany [name], CMA; 02/19/2015 2:05 PM) Colonoscopy never Mammogram never Pap Smear 1-5 years ago  Allergies Tiffany [name](Tiffany [name], CMA; 02/19/2015 2:06 PM) No Known Drug Allergies03/22/2016  Medication History (Tiffany [name], CMA; 02/19/2015 2:07 PM) Albuterol Sulfate (108 (90 Base)MCG/ACT Aero Pow Br Act, Inhalation) Active. Medications Reconciled  Social History Tiffany [name](Tiffany [name], CMA; 02/19/2015 2:05 PM) Alcohol use Occasional alcohol use. Caffeine use Carbonated beverages. No drug use Tobacco use Current every day smoker.  Family History Tiffany [name](Tiffany [name], CMA; 02/19/2015 2:05 PM) First Degree Relatives No pertinent family history  Pregnancy / Birth History Tiffany [name](Tiffany [name], CMA; 02/19/2015 2:05 PM) Age at menarche 15 years. Gravida 1 Irregular periods Maternal age 23-25 Para 1  Review of Systems Tiffany [name](Tiffany [name] CMA; 02/19/2015 2:05 PM) Skin Not Present- Change in Wart/Mole, Dryness, Hives, Jaundice, New  Lesions, Non-Healing Wounds, Rash and Ulcer. HEENT Present- Wears glasses/contact lenses. Not Present- Earache, Hearing Loss, Hoarseness, Nose Bleed, Oral Ulcers, Ringing in the Ears, Seasonal Allergies, Sinus Pain, Sore Throat, Visual Disturbances and Yellow Eyes. Respiratory Present- Difficulty Breathing and Wheezing. Not Present- Bloody sputum, Chronic Cough and Snoring. Cardiovascular Present- Shortness of Breath. Not Present- Chest Pain, Difficulty Breathing Lying Down, Leg Cramps, Palpitations, Rapid Heart Rate and Swelling of Extremities. Gastrointestinal Not Present- Abdominal Pain, Bloating, Bloody Stool, Change in Bowel Habits, Chronic diarrhea, Constipation, Difficulty Swallowing, Excessive gas, Gets full quickly at meals, Hemorrhoids, Indigestion, Nausea, Rectal Pain and Vomiting. Female Genitourinary Not Present- Frequency, Nocturia, Painful Urination, Pelvic Pain and Urgency. Musculoskeletal Present- Back Pain, Muscle Pain and Muscle Weakness. Not Present- Joint Pain, Joint Stiffness and Swelling of Extremities. Neurological Present- Headaches. Not Present- Decreased Memory, Fainting, Numbness, Seizures, Tingling, Tremor, Trouble walking and Weakness. Psychiatric Present- Anxiety. Not Present- Bipolar, Change in Sleep Pattern, Depression, Fearful and Frequent crying. Endocrine Present- Hot flashes. Not Present- Cold Intolerance, Excessive Hunger, Hair Changes, Heat Intolerance and New Diabetes.   Vitals (Tiffany [name] CMA; 02/19/2015 2:06 PM) 02/19/2015 2:06 PM Weight: 175 lb Height: 64in Body Surface Area: 1.89 m Body Mass Index: 30.04 kg/m Temp.: 97.65F(Temporal)  Pulse: 79 (Regular)  BP: 136/84 (Sitting, Left Arm, Standard)    Physical Exam [name](Shahil Speegle K. Shanel Prazak MD; 02/21/2015 9:59 AM) The physical exam findings are as follows: Note:WDWN in NAD Left arm just above crease in antecubital fossa - 7 mm firm palpable subcutaneous mass with some surrounding ecchymosis. No  sign of infection or inflammation.    Assessment & Plan Tiffany [name](Sharif Rendell K. Omega Durante MD; 02/19/2015 2:38 PM) PHLEBOLITH (459.89  I87.8) Current Plans  Schedule for Surgery - Excision of  phlebolith - left antecubital fossa. The surgical procedure has been discussed with the patient. Potential risks, benefits, alternative treatments, and expected outcomes have been explained. All of the patient's questions at this time have been answered. The likelihood of reaching the patient's treatment goal is good. The patient understand the proposed surgical procedure and wishes to proceed.    Tiffany [name]. [name], MD, [facility]  General/ Trauma Surgery  02/21/2015 10:01 AM

## 2015-03-18 ENCOUNTER — Encounter (HOSPITAL_COMMUNITY): Payer: Self-pay | Admitting: Surgery

## 2015-06-04 DIAGNOSIS — Z8679 Personal history of other diseases of the circulatory system: Secondary | ICD-10-CM | POA: Insufficient documentation

## 2015-06-04 DIAGNOSIS — Z8659 Personal history of other mental and behavioral disorders: Secondary | ICD-10-CM | POA: Insufficient documentation

## 2015-06-04 DIAGNOSIS — B349 Viral infection, unspecified: Secondary | ICD-10-CM | POA: Insufficient documentation

## 2015-06-04 DIAGNOSIS — Z720 Tobacco use: Secondary | ICD-10-CM | POA: Insufficient documentation

## 2015-06-04 DIAGNOSIS — Z79899 Other long term (current) drug therapy: Secondary | ICD-10-CM | POA: Insufficient documentation

## 2015-06-04 DIAGNOSIS — Z8669 Personal history of other diseases of the nervous system and sense organs: Secondary | ICD-10-CM | POA: Insufficient documentation

## 2015-06-04 DIAGNOSIS — Z862 Personal history of diseases of the blood and blood-forming organs and certain disorders involving the immune mechanism: Secondary | ICD-10-CM | POA: Insufficient documentation

## 2015-06-04 DIAGNOSIS — K529 Noninfective gastroenteritis and colitis, unspecified: Secondary | ICD-10-CM | POA: Insufficient documentation

## 2015-06-04 DIAGNOSIS — J4521 Mild intermittent asthma with (acute) exacerbation: Secondary | ICD-10-CM | POA: Insufficient documentation

## 2015-06-04 DIAGNOSIS — Z3202 Encounter for pregnancy test, result negative: Secondary | ICD-10-CM | POA: Insufficient documentation

## 2015-06-05 ENCOUNTER — Emergency Department (HOSPITAL_COMMUNITY)
Admission: EM | Admit: 2015-06-05 | Discharge: 2015-06-05 | Disposition: A | Discharge status: Home / Self Care | Payer: Medicaid Other | Attending: Emergency Medicine | Admitting: Emergency Medicine

## 2015-06-05 ENCOUNTER — Encounter (HOSPITAL_COMMUNITY): Payer: Self-pay | Admitting: Emergency Medicine

## 2015-06-05 DIAGNOSIS — B349 Viral infection, unspecified: Secondary | ICD-10-CM

## 2015-06-05 DIAGNOSIS — K529 Noninfective gastroenteritis and colitis, unspecified: Secondary | ICD-10-CM

## 2015-06-05 DIAGNOSIS — J4520 Mild intermittent asthma, uncomplicated: Secondary | ICD-10-CM

## 2015-06-05 LAB — URINALYSIS, ROUTINE W REFLEX MICROSCOPIC
Bilirubin Urine: NEGATIVE
GLUCOSE, UA: NEGATIVE mg/dL
Ketones, ur: NEGATIVE mg/dL
Leukocytes, UA: NEGATIVE
Nitrite: NEGATIVE
PROTEIN: NEGATIVE mg/dL
Specific Gravity, Urine: 1.013 (ref 1.005–1.030)
Urobilinogen, UA: 0.2 mg/dL (ref 0.0–1.0)
pH: 6 (ref 5.0–8.0)

## 2015-06-05 LAB — CBC WITH DIFFERENTIAL/PLATELET
BASOS ABS: 0 10*3/uL (ref 0.0–0.1)
Basophils Relative: 0 % (ref 0–1)
EOS ABS: 0.1 10*3/uL (ref 0.0–0.7)
Eosinophils Relative: 1 % (ref 0–5)
HCT: 40.3 % (ref 36.0–46.0)
HEMOGLOBIN: 13.7 g/dL (ref 12.0–15.0)
Lymphocytes Relative: 25 % (ref 12–46)
Lymphs Abs: 1.9 10*3/uL (ref 0.7–4.0)
MCH: 30.9 pg (ref 26.0–34.0)
MCHC: 34 g/dL (ref 30.0–36.0)
MCV: 91 fL (ref 78.0–100.0)
MONOS PCT: 12 % (ref 3–12)
Monocytes Absolute: 0.9 10*3/uL (ref 0.1–1.0)
NEUTROS ABS: 4.7 10*3/uL (ref 1.7–7.7)
NEUTROS PCT: 62 % (ref 43–77)
PLATELETS: 233 10*3/uL (ref 150–400)
RBC: 4.43 MIL/uL (ref 3.87–5.11)
RDW: 13.3 % (ref 11.5–15.5)
WBC: 7.6 10*3/uL (ref 4.0–10.5)

## 2015-06-05 LAB — COMPREHENSIVE METABOLIC PANEL
ALT: 23 U/L (ref 14–54)
ANION GAP: 11 (ref 5–15)
AST: 29 U/L (ref 15–41)
Albumin: 4.3 g/dL (ref 3.5–5.0)
Alkaline Phosphatase: 45 U/L (ref 38–126)
BUN: <5 mg/dL — ABNORMAL LOW (ref 6–20)
CALCIUM: 9.4 mg/dL (ref 8.9–10.3)
CO2: 23 mmol/L (ref 22–32)
CREATININE: 0.79 mg/dL (ref 0.44–1.00)
Chloride: 103 mmol/L (ref 101–111)
GLUCOSE: 96 mg/dL (ref 65–99)
Potassium: 4.2 mmol/L (ref 3.5–5.1)
Sodium: 137 mmol/L (ref 135–145)
TOTAL PROTEIN: 7.8 g/dL (ref 6.5–8.1)
Total Bilirubin: 0.5 mg/dL (ref 0.3–1.2)

## 2015-06-05 LAB — LIPASE, BLOOD: Lipase: 21 U/L — ABNORMAL LOW (ref 22–51)

## 2015-06-05 LAB — URINE MICROSCOPIC-ADD ON

## 2015-06-05 LAB — POC URINE PREG, ED: PREG TEST UR: NEGATIVE

## 2015-06-05 MED ORDER — ALBUTEROL SULFATE HFA 108 (90 BASE) MCG/ACT IN AERS
2.0000 | INHALATION_SPRAY | Freq: Once | RESPIRATORY_TRACT | Status: AC
  Administered 2015-06-05: 2 via RESPIRATORY_TRACT
  Filled 2015-06-05: qty 6.7

## 2015-06-05 MED ORDER — LOPERAMIDE HCL 2 MG PO CAPS: 2.0000 mg | ORAL_CAPSULE | ORAL | Status: DC | PRN

## 2015-06-05 MED ORDER — LOPERAMIDE HCL 2 MG PO CAPS
4.0000 mg | ORAL_CAPSULE | Freq: Once | ORAL | Status: AC
  Administered 2015-06-05: 4 mg via ORAL
  Filled 2015-06-05: qty 2

## 2015-06-05 MED ORDER — PREDNISONE 20 MG PO TABS
40.0000 mg | ORAL_TABLET | Freq: Every day | ORAL | Status: DC
Start: 2015-06-05 — End: 2018-06-27

## 2015-06-05 MED ORDER — ALBUTEROL SULFATE HFA 108 (90 BASE) MCG/ACT IN AERS: 2.0000 | INHALATION_SPRAY | RESPIRATORY_TRACT | Status: DC | PRN

## 2015-06-05 MED ORDER — ONDANSETRON HCL 4 MG/2ML IJ SOLN
4.0000 mg | Freq: Once | INTRAMUSCULAR | Status: AC
  Administered 2015-06-05: 4 mg via INTRAVENOUS
  Filled 2015-06-05: qty 2

## 2015-06-05 MED ORDER — SODIUM CHLORIDE 0.9 % IV BOLUS (SEPSIS)
1000.0000 mL | Freq: Once | INTRAVENOUS | Status: AC
  Administered 2015-06-05: 1000 mL via INTRAVENOUS

## 2015-06-05 MED ORDER — ONDANSETRON 4 MG PO TBDP: 4.0000 mg | ORAL_TABLET | Freq: Three times a day (TID) | ORAL | Status: DC | PRN

## 2015-06-05 NOTE — ED Notes (Signed)
Pt. presents with multiple complaints:  emesis , diarrhea , generalized abdominal pain with headache and fatigue onset this weekend . Denies fever or chills.

## 2015-06-05 NOTE — ED Provider Notes (Signed)
CSN: [id number]     Arrival date & time 06/04/15  2325 History  This chart was scribed for Tiffany Baton, MD by Evon Slack, ED Scribe. This patient was seen in room A09C/A09C and the patient's care was started at 12:44 AM.      Chief Complaint  Patient presents with  . Emesis  . Diarrhea   The history is provided by the patient. No language interpreter was used.   HPI Comments: Tiffany STORLIE is a 24 y.o. female who presents to the Emergency Department complaining of vomiting onset onset 3 days prior. She states that she has associated slight abdominal pain, nausea, diarrhea, productive cough and HA. Pt doesn't report any medications PTA. She doesn't report any alleviating factors. Pt denies fever or chills. Pt does report recently been around her daughter who has similar symptoms.   Past Medical History  Diagnosis Date  . Asthma     Albuterol inhaler prn   . Shortness of breath dyspnea     with exertion or laying straight back  . History of migraine     last one a week ago  . Anemia     hx of  . Anxiety     not on any meds  . Insomnia     doesn't take any meds   Past Surgical History  Procedure Laterality Date  . Wisdom tooth extraction  2009  . Lipoma excision Left 03/14/2015    Procedure: EXCISION OF LEFT ANTECUBITAL PHLEBOLITH;  Surgeon: Manus Rudd, MD;  Location: [facility] OR;  Service: General;  Laterality: Left;   Family History  Problem Relation Age of Onset  . Other Neg Hx   . Alcohol abuse Neg Hx   . Arthritis Neg Hx   . Birth defects Neg Hx   . Cancer Neg Hx   . COPD Neg Hx   . Depression Neg Hx   . Diabetes Neg Hx   . Drug abuse Neg Hx   . Early death Neg Hx   . Hearing loss Neg Hx   . Heart disease Neg Hx   . Hyperlipidemia Neg Hx   . Hypertension Neg Hx   . Kidney disease Neg Hx   . Learning disabilities Neg Hx   . Mental illness Neg Hx   . Mental retardation Neg Hx   . Miscarriages / Stillbirths Neg Hx   . Stroke Neg Hx   . Vision loss Neg  Hx   . Asthma Sister   . Asthma Brother   . Asthma Other   . Asthma Other    History  Substance Use Topics  . Smoking status: Current Every Day Smoker -- 0.10 packs/day for 4 years    Types: Cigars, Cigarettes  . Smokeless tobacco: Never Used     Comment: 2 cigs/day  . Alcohol Use: No   OB History    Gravida Para Term Preterm AB TAB SAB Ectopic Multiple Living   1 1 1  0 0 0 0 0 0 1     Review of Systems  Constitutional: Negative for fever and chills.  Respiratory: Positive for cough. Negative for chest tightness and shortness of breath.   Cardiovascular: Negative for chest pain.  Gastrointestinal: Positive for nausea, vomiting and diarrhea. Negative for abdominal pain.  Genitourinary: Negative for dysuria.  Musculoskeletal: Negative for back pain.  Skin: Negative for wound.  Neurological: Negative for headaches.  All other systems reviewed and are negative.     Allergies  Review of patient's  allergies indicates no known allergies.  Home Medications   Prior to Admission medications   Medication Sig Start Date End Date Taking? Authorizing Provider  albuterol (PROVENTIL HFA;VENTOLIN HFA) 108 (90 BASE) MCG/ACT inhaler Inhale 2 puffs into the lungs every 4 (four) hours as needed for wheezing or shortness of breath. 06/05/15   Tiffany Baton, MD  HYDROcodone-acetaminophen (NORCO/VICODIN) 5-325 MG per tablet Take 1 tablet by mouth every 4 (four) hours as needed. 03/14/15   Manus Rudd, MD  loperamide (IMODIUM) 2 MG capsule Take 1 capsule (2 mg total) by mouth as needed for diarrhea or loose stools. 06/05/15   Tiffany Baton, MD  ondansetron (ZOFRAN ODT) 4 MG disintegrating tablet Take 1 tablet (4 mg total) by mouth every 8 (eight) hours as needed for nausea or vomiting. 06/05/15   Tiffany Baton, MD  predniSONE (DELTASONE) 20 MG tablet Take 2 tablets (40 mg total) by mouth daily with breakfast. 06/05/15   Tiffany Baton, MD   BP 120/76 mmHg  Pulse 90  Temp(Src) 98.5 F  (36.9 C) (Oral)  Resp 18  Ht  (1.626 m)  Wt 166 lb 3 oz (75.382 kg)  BMI 28.51 kg/m2  SpO2 96%   Physical Exam  Constitutional: She is oriented to person, place, and time. She appears well-developed and well-nourished. No distress.  HENT:  Head: Normocephalic and atraumatic.  Cardiovascular: Normal rate, regular rhythm and normal heart sounds.   No murmur heard. Pulmonary/Chest: Effort normal. No respiratory distress. She has wheezes.  Abdominal: Soft. Bowel sounds are normal. There is no tenderness. There is no rebound and no guarding.  Neurological: She is alert and oriented to person, place, and time.  Skin: Skin is warm and dry.  Psychiatric: She has a normal mood and affect.  Nursing note and vitals reviewed.   ED Course  Procedures (including critical care time) DIAGNOSTIC STUDIES: Oxygen Saturation is 98% on RA, normal by my interpretation.    COORDINATION OF CARE: 12:53 AM-Discussed treatment plan with pt at bedside and pt agreed to plan.     Labs Review Labs Reviewed  COMPREHENSIVE METABOLIC PANEL - Abnormal; Notable for the following:    BUN <5 (*)    All other components within normal limits  LIPASE, BLOOD - Abnormal; Notable for the following:    Lipase 21 (*)    All other components within normal limits  URINALYSIS, ROUTINE W REFLEX MICROSCOPIC (NOT AT [facility]) - Abnormal; Notable for the following:    APPearance HAZY (*)    Hgb urine dipstick MODERATE (*)    All other components within normal limits  URINE MICROSCOPIC-ADD ON - Abnormal; Notable for the following:    Squamous Epithelial / LPF FEW (*)    All other components within normal limits  CBC WITH DIFFERENTIAL/PLATELET  POC URINE PREG, ED    Imaging Review No results found.   EKG Interpretation None      MDM   Final diagnoses:  Viral syndrome  Gastroenteritis  Asthma, mild intermittent, uncomplicated    Patient presents with several days of nausea, vomiting, diarrhea, cough.  Reports generalized fatigue. Daughter with similar symptoms. Daughter was diagnosed with a "virus" prior to arrival.  Patient is well-appearing and nontoxic. No abdominal tenderness. She is wheezing on exam. History of asthma and is out of her inhaler. No other respiratory distress.  Given patient's vital signs and physical exam, suspect viral syndrome with bronchitis-like symptoms and gastroenteritis. Lab work is reassuring. Patient was given fluids,  Imodium, and Zofran. She was also provided an HFA inhaler. Will discharge with instructions for symptomatic control.  After history, exam, and medical workup I feel the patient has been appropriately medically screened and is safe for discharge home. Pertinent diagnoses were discussed with the patient. Patient was given return precautions.  I personally performed the services described in this documentation, which was scribed in my presence. The recorded information has been reviewed and is accurate.      Tiffany Batonourtney F Horton, MD 06/05/15 [phone]

## 2015-06-05 NOTE — ED Notes (Signed)
Pt A&Ox4, ambulatory at d/c with steady gait, NAD 

## 2015-06-05 NOTE — Discharge Instructions (Signed)

## 2015-06-09 IMAGING — CR DG HAND COMPLETE 3+V*R*
3 series · 3 of 3 positions shown · non-contrast
Comparison: None.

CLINICAL DATA: Fall.  Hand pain.  Decreased range of motion.

EXAM:
RIGHT HAND - COMPLETE 3+ VIEW

[x hand pa right]
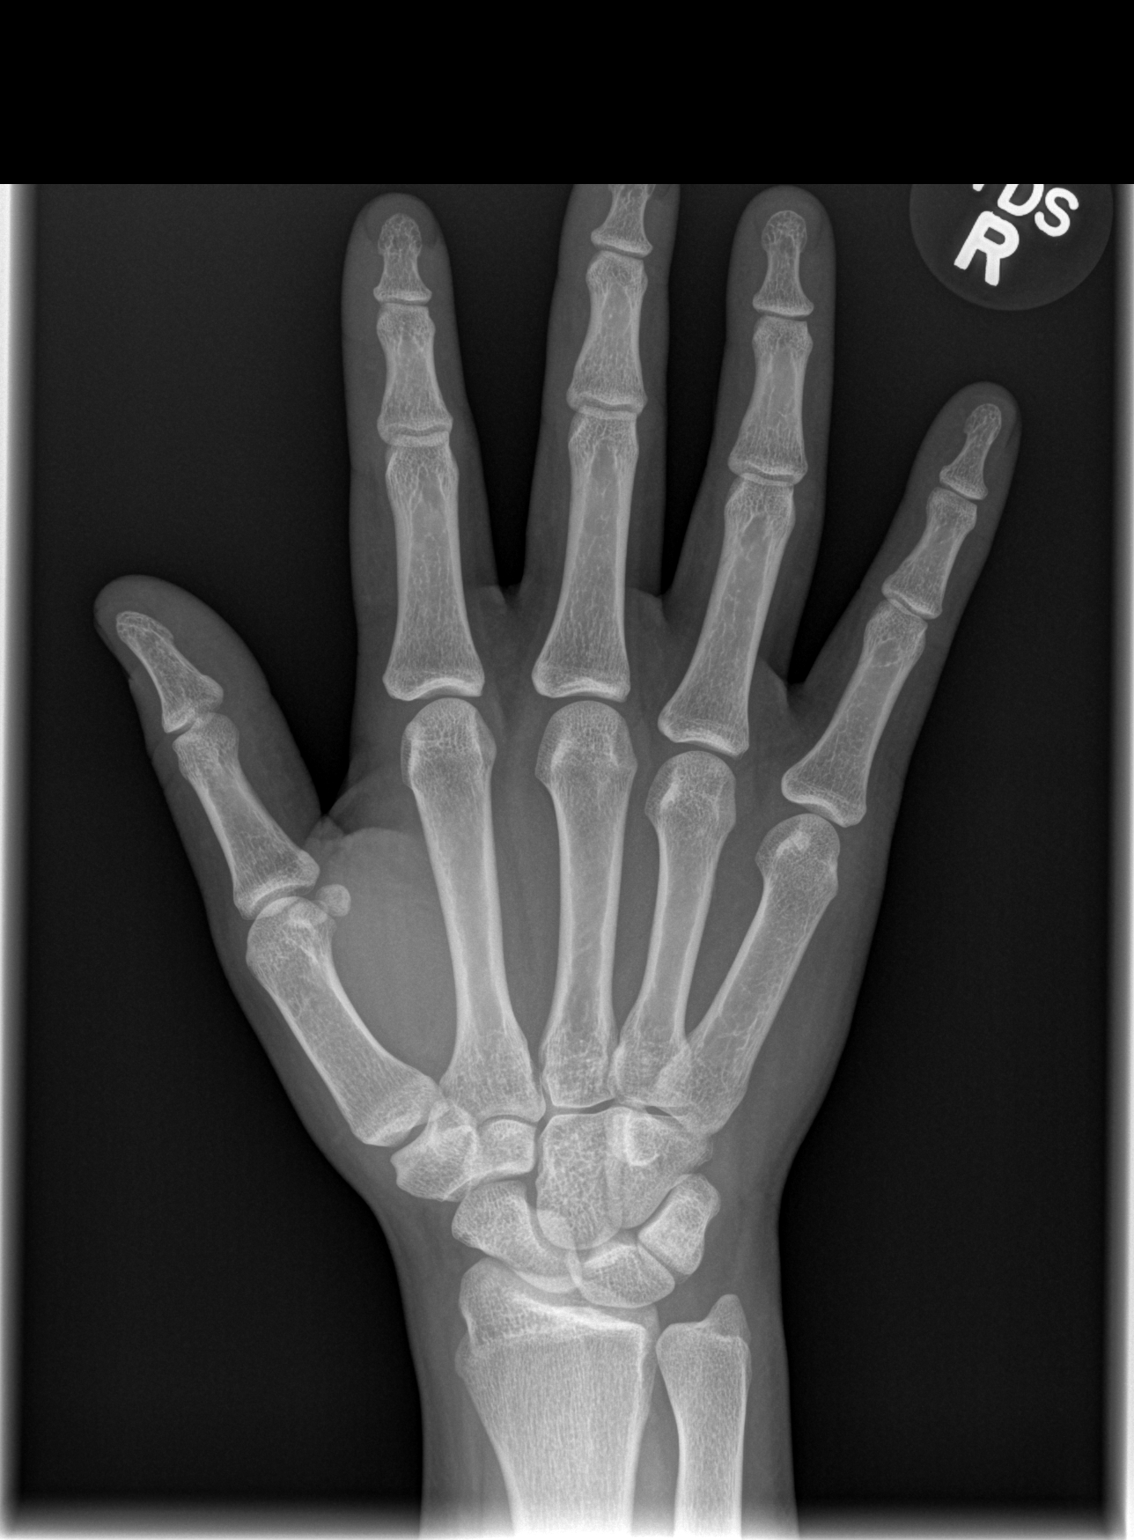

[x hand oblique right]
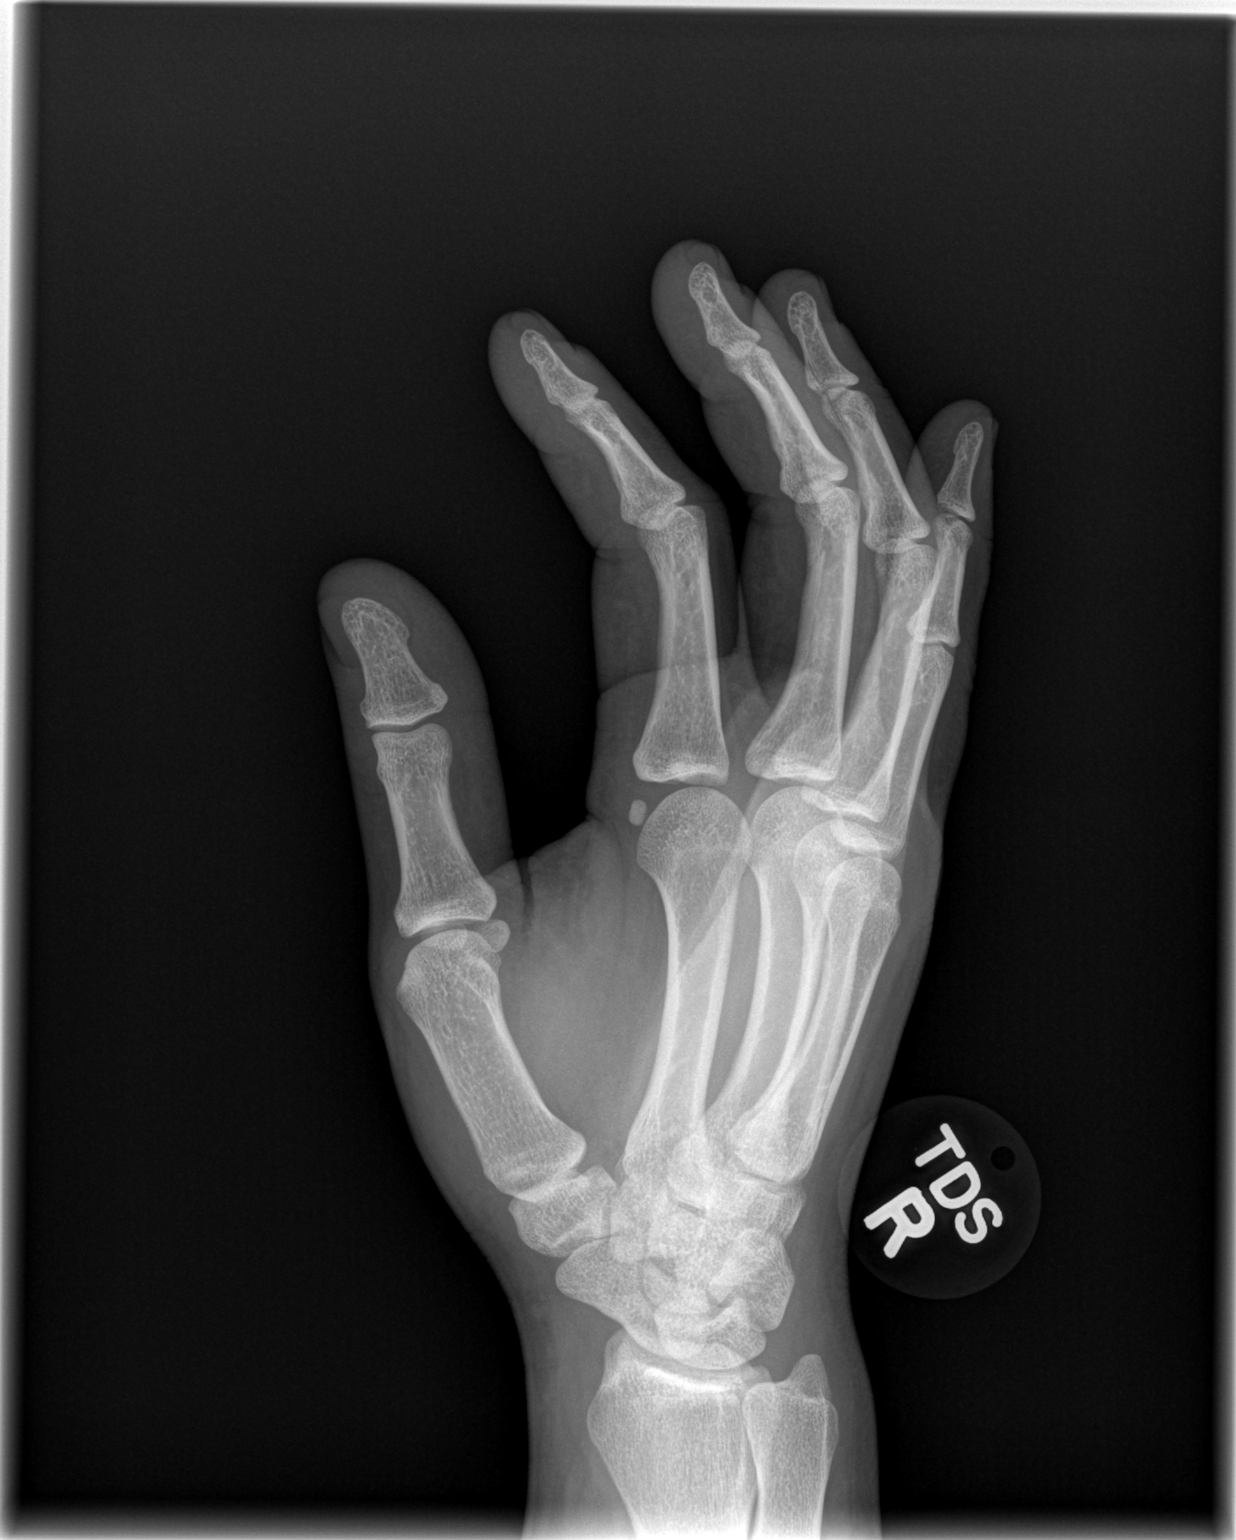

[x hand lat right]
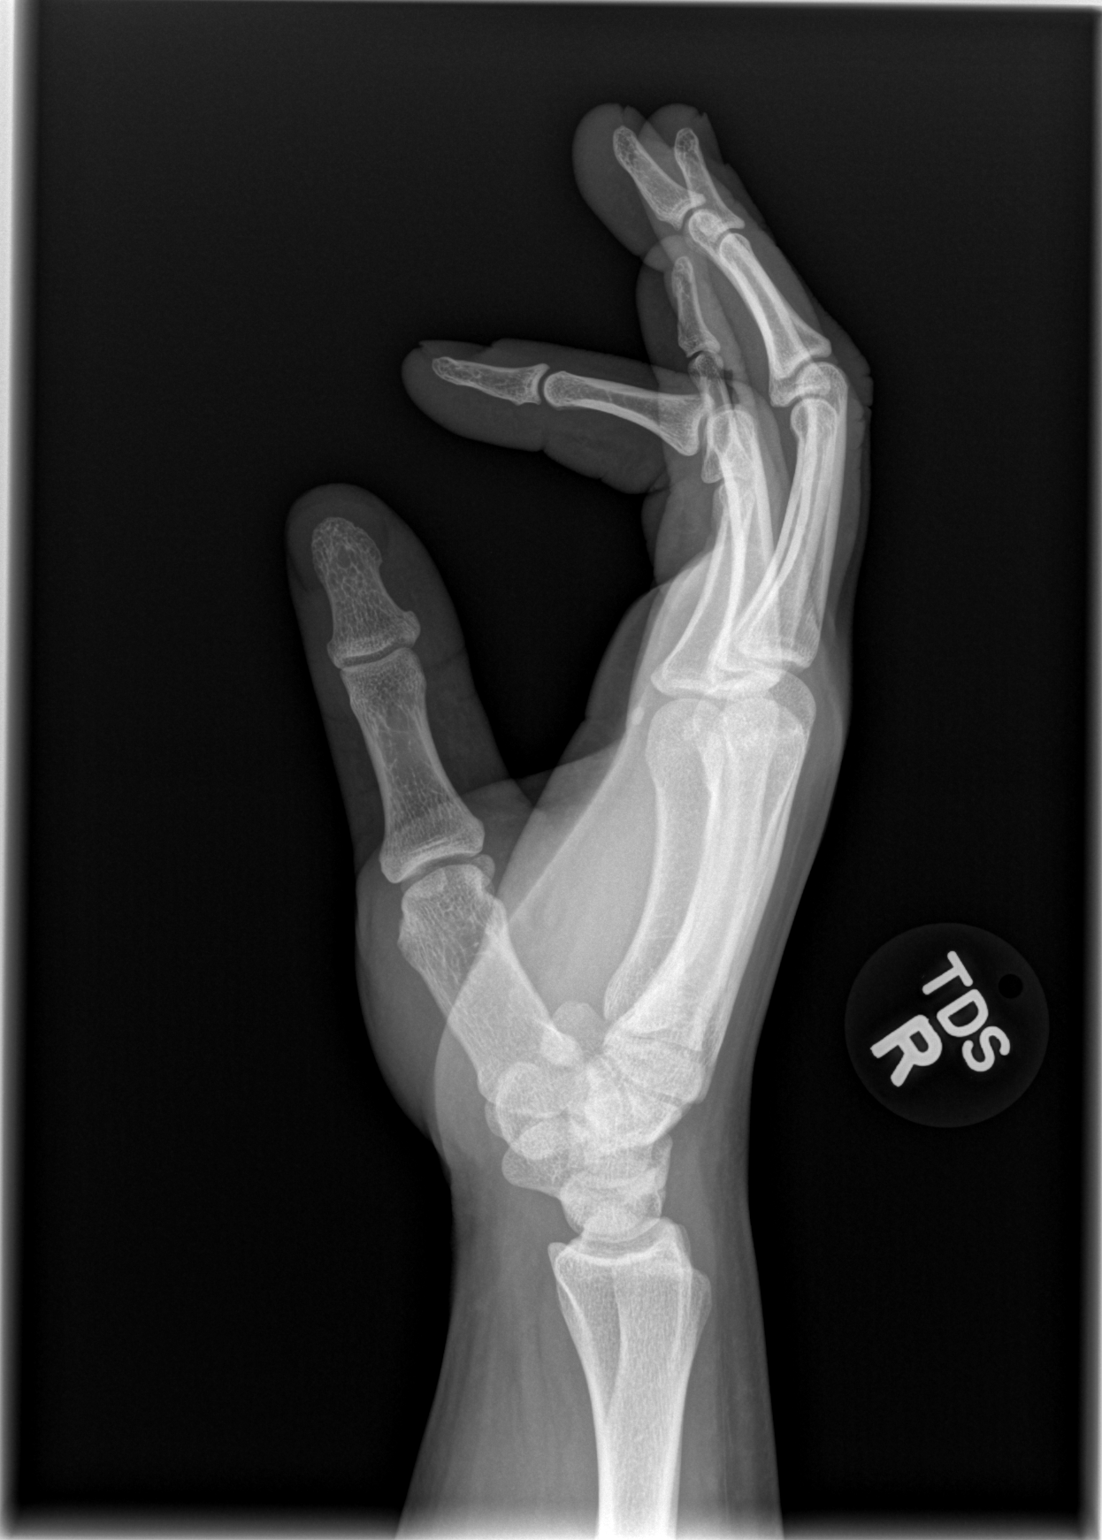

[3 of 3 positions shown; findings below may reference images not displayed]

FINDINGS: There is no evidence of fracture or dislocation. There is no
evidence of arthropathy or other focal bone abnormality. Soft
tissues are unremarkable.
IMPRESSION: Negative.

## 2015-06-11 ENCOUNTER — Emergency Department (HOSPITAL_COMMUNITY)
Admission: EM | Admit: 2015-06-11 | Discharge: 2015-06-11 | Disposition: A | Discharge status: Home / Self Care | Payer: Medicaid Other | Attending: Emergency Medicine | Admitting: Emergency Medicine

## 2015-06-11 ENCOUNTER — Encounter (HOSPITAL_COMMUNITY): Payer: Self-pay

## 2015-06-11 DIAGNOSIS — Z8679 Personal history of other diseases of the circulatory system: Secondary | ICD-10-CM | POA: Insufficient documentation

## 2015-06-11 DIAGNOSIS — Z8659 Personal history of other mental and behavioral disorders: Secondary | ICD-10-CM | POA: Insufficient documentation

## 2015-06-11 DIAGNOSIS — Z862 Personal history of diseases of the blood and blood-forming organs and certain disorders involving the immune mechanism: Secondary | ICD-10-CM | POA: Insufficient documentation

## 2015-06-11 DIAGNOSIS — Z720 Tobacco use: Secondary | ICD-10-CM | POA: Insufficient documentation

## 2015-06-11 DIAGNOSIS — J40 Bronchitis, not specified as acute or chronic: Secondary | ICD-10-CM

## 2015-06-11 DIAGNOSIS — R51 Headache: Secondary | ICD-10-CM | POA: Insufficient documentation

## 2015-06-11 DIAGNOSIS — Z79899 Other long term (current) drug therapy: Secondary | ICD-10-CM | POA: Insufficient documentation

## 2015-06-11 DIAGNOSIS — J45901 Unspecified asthma with (acute) exacerbation: Secondary | ICD-10-CM | POA: Insufficient documentation

## 2015-06-11 DIAGNOSIS — Z7952 Long term (current) use of systemic steroids: Secondary | ICD-10-CM | POA: Insufficient documentation

## 2015-06-11 MED ORDER — BENZONATATE 100 MG PO CAPS: 100.0000 mg | ORAL_CAPSULE | Freq: Three times a day (TID) | ORAL | Status: DC

## 2015-06-11 MED ORDER — BENZONATATE 100 MG PO CAPS
200.0000 mg | ORAL_CAPSULE | Freq: Once | ORAL | Status: AC
  Administered 2015-06-11: 200 mg via ORAL
  Filled 2015-06-11: qty 2

## 2015-06-11 MED ORDER — IBUPROFEN 400 MG PO TABS
400.0000 mg | ORAL_TABLET | Freq: Once | ORAL | Status: AC
  Administered 2015-06-11: 400 mg via ORAL
  Filled 2015-06-11: qty 1

## 2015-06-11 MED ORDER — ALBUTEROL SULFATE (5 MG/ML) 0.5% IN NEBU: 5.0000 mg | INHALATION_SOLUTION | Freq: Once | RESPIRATORY_TRACT | Status: DC

## 2015-06-11 MED ORDER — AZITHROMYCIN 250 MG PO TABS: 250.0000 mg | ORAL_TABLET | Freq: Every day | ORAL | Status: DC

## 2015-06-11 MED ORDER — ALBUTEROL SULFATE (2.5 MG/3ML) 0.083% IN NEBU
INHALATION_SOLUTION | RESPIRATORY_TRACT | Status: AC
  Filled 2015-06-11: qty 6

## 2015-06-11 NOTE — ED Provider Notes (Signed)
CSN: [id number]     Arrival date & time 06/11/15  1805 History   First MD Initiated Contact with Patient 06/11/15 1956     Chief Complaint  Patient presents with  . Cough  . Headache     (Consider location/radiation/quality/duration/timing/severity/associated sxs/prior Treatment) HPI Comments: The patient is a 24 year old female who presents to the hospital with a complaint of coughing and shortness of breath. She has been coughing for approximately 6 days, this is persistent, nothing changes, it is productive of thick green and yellow phlegm which occasionally has blood-tinged sputum, she does have subjective fevers and chills and diffuse myalgias. She was initially seen approximately 8 days ago, given an albuterol inhaler and has been using this 2 puffs every 4 hours with only transient minimal relief. She does have a history of asthma, reactive airway disease but has not needed inhalers except for the change of the seasons in the past.  Patient is a 24 y.o. female presenting with cough and headaches. The history is provided by the patient.  Cough Associated symptoms: headaches   Headache Associated symptoms: cough     Past Medical History  Diagnosis Date  . Asthma     Albuterol inhaler prn   . Shortness of breath dyspnea     with exertion or laying straight back  . History of migraine     last one a week ago  . Anemia     hx of  . Anxiety     not on any meds  . Insomnia     doesn't take any meds   Past Surgical History  Procedure Laterality Date  . Wisdom tooth extraction  2009  . Lipoma excision Left 03/14/2015    Procedure: EXCISION OF LEFT ANTECUBITAL PHLEBOLITH;  Surgeon: Manus Rudd, MD;  Location: [facility] OR;  Service: General;  Laterality: Left;   Family History  Problem Relation Age of Onset  . Other Neg Hx   . Alcohol abuse Neg Hx   . Arthritis Neg Hx   . Birth defects Neg Hx   . Cancer Neg Hx   . COPD Neg Hx   . Depression Neg Hx   . Diabetes Neg Hx   .  Drug abuse Neg Hx   . Early death Neg Hx   . Hearing loss Neg Hx   . Heart disease Neg Hx   . Hyperlipidemia Neg Hx   . Hypertension Neg Hx   . Kidney disease Neg Hx   . Learning disabilities Neg Hx   . Mental illness Neg Hx   . Mental retardation Neg Hx   . Miscarriages / Stillbirths Neg Hx   . Stroke Neg Hx   . Vision loss Neg Hx   . Asthma Sister   . Asthma Brother   . Asthma Other   . Asthma Other    History  Substance Use Topics  . Smoking status: Current Every Day Smoker -- 0.10 packs/day for 4 years    Types: Cigars, Cigarettes  . Smokeless tobacco: Never Used     Comment: 2 cigs/day  . Alcohol Use: No   OB History    Gravida Para Term Preterm AB TAB SAB Ectopic Multiple Living   1 1 1  0 0 0 0 0 0 1     Review of Systems  Respiratory: Positive for cough.   Neurological: Positive for headaches.  All other systems reviewed and are negative.     Allergies  Review of patient's allergies indicates no known  allergies.  Home Medications   Prior to Admission medications   Medication Sig Start Date End Date Taking? Authorizing Provider  albuterol (PROVENTIL HFA;VENTOLIN HFA) 108 (90 BASE) MCG/ACT inhaler Inhale 2 puffs into the lungs every 4 (four) hours as needed for wheezing or shortness of breath. 06/05/15   Shon Batonourtney F Horton, MD  azithromycin (ZITHROMAX) 250 MG tablet Take 1 tablet (250 mg total) by mouth daily. Take first 2 tablets together, then 1 every day until finished. 06/11/15   Eber HongBrian Javeon Macmurray, MD  benzonatate (TESSALON) 100 MG capsule Take 1 capsule (100 mg total) by mouth every 8 (eight) hours. 06/11/15   Eber HongBrian Keian Odriscoll, MD  HYDROcodone-acetaminophen (NORCO/VICODIN) 5-325 MG per tablet Take 1 tablet by mouth every 4 (four) hours as needed. 03/14/15   Manus RuddMatthew Tsuei, MD  loperamide (IMODIUM) 2 MG capsule Take 1 capsule (2 mg total) by mouth as needed for diarrhea or loose stools. 06/05/15   Shon Batonourtney F Horton, MD  ondansetron (ZOFRAN ODT) 4 MG disintegrating tablet  Take 1 tablet (4 mg total) by mouth every 8 (eight) hours as needed for nausea or vomiting. 06/05/15   Shon Batonourtney F Horton, MD  predniSONE (DELTASONE) 20 MG tablet Take 2 tablets (40 mg total) by mouth daily with breakfast. 06/05/15   Shon Batonourtney F Horton, MD   BP 132/81 mmHg  Pulse 106  Temp(Src) 99.2 F (37.3 C) (Oral)  Resp 18  SpO2 99% Physical Exam  Constitutional: She appears well-developed and well-nourished. No distress.  HENT:  Head: Normocephalic and atraumatic.  Mouth/Throat: Oropharynx is clear and moist. No oropharyngeal exudate.  Oropharynx is mildly erythematous, there is some purulent discharge in the nostrils as well as the posterior nasopharynx  Eyes: Conjunctivae and EOM are normal. Pupils are equal, round, and reactive to light. Right eye exhibits no discharge. Left eye exhibits no discharge. No scleral icterus.  Neck: Normal range of motion. Neck supple. No JVD present. No thyromegaly present.  Cardiovascular: Normal rate, regular rhythm, normal heart sounds and intact distal pulses.  Exam reveals no gallop and no friction rub.   No murmur heard. Pulmonary/Chest: Effort normal. No respiratory distress. She has wheezes. She has no rales.  Abdominal: Soft. Bowel sounds are normal. She exhibits no distension and no mass. There is no tenderness.  Musculoskeletal: Normal range of motion. She exhibits no edema or tenderness.  Lymphadenopathy:    She has no cervical adenopathy.  Neurological: She is alert. Coordination normal.  Skin: Skin is warm and dry. No rash noted. No erythema.  Psychiatric: She has a normal mood and affect. Her behavior is normal.  Nursing note and vitals reviewed.   ED Course  Procedures (including critical care time) Labs Review Labs Reviewed - No data to display  Imaging Review No results found.   MDM   Final diagnoses:  Bronchitis    The patient has a heart rate of 100, she has expiratory wheezing, she is coughing up purulent sputum and her  symptoms have been ongoing over one week and did not improve with prednisone and an albuterol inhaler from her last visit. I will prescribe Zithromax and Tessalon, she is in agreement with the plan. There is no indication for imaging or further testing or treatment at this time. The patient has been able to express her understanding to the diagnosis and the indications for return.  Meds given in ED:  Medications  benzonatate (TESSALON) capsule 200 mg (not administered)  ibuprofen (ADVIL,MOTRIN) tablet 400 mg (400 mg Oral Given 06/11/15 1837)  New Prescriptions   AZITHROMYCIN (ZITHROMAX) 250 MG TABLET    Take 1 tablet (250 mg total) by mouth daily. Take first 2 tablets together, then 1 every day until finished.   BENZONATATE (TESSALON) 100 MG CAPSULE    Take 1 capsule (100 mg total) by mouth every 8 (eight) hours.      Eber Hong, MD 06/11/15 2015

## 2015-06-11 NOTE — ED Notes (Signed)
Pt reports onset 06-04-15 cough, nasal congestion, sneezing, runny nose and headache.  Symptoms are worse.  Coughing causing vomiting and interfering with sleep.  Took Acetaminophen 1000 mg @ 9:30am.

## 2015-06-11 NOTE — ED Notes (Signed)
Patient with steady gait. Wheelchair offered but patient requested to ambulate

## 2015-06-11 NOTE — Discharge Instructions (Signed)
Please call your doctor for a followup appointment within 24-48 hours. When you talk to your doctor please let them know that you were seen in the emergency department and have them acquire all of your records so that they can discuss the findings with you and formulate a treatment plan to fully care for your new and ongoing problems. ° °

## 2016-02-23 ENCOUNTER — Emergency Department (HOSPITAL_COMMUNITY): Payer: Medicaid Other

## 2016-02-23 ENCOUNTER — Emergency Department (HOSPITAL_COMMUNITY)
Admission: EM | Admit: 2016-02-23 | Discharge: 2016-02-23 | Disposition: A | Discharge status: Home / Self Care | Payer: Medicaid Other | Attending: Emergency Medicine | Admitting: Emergency Medicine

## 2016-02-23 ENCOUNTER — Encounter (HOSPITAL_COMMUNITY): Payer: Self-pay | Admitting: *Deleted

## 2016-02-23 DIAGNOSIS — Z8679 Personal history of other diseases of the circulatory system: Secondary | ICD-10-CM | POA: Diagnosis not present

## 2016-02-23 DIAGNOSIS — Y998 Other external cause status: Secondary | ICD-10-CM | POA: Diagnosis not present

## 2016-02-23 DIAGNOSIS — S91352A Open bite, left foot, initial encounter: Secondary | ICD-10-CM

## 2016-02-23 DIAGNOSIS — W540XXA Bitten by dog, initial encounter: Secondary | ICD-10-CM | POA: Insufficient documentation

## 2016-02-23 DIAGNOSIS — Y9389 Activity, other specified: Secondary | ICD-10-CM | POA: Insufficient documentation

## 2016-02-23 DIAGNOSIS — S91312A Laceration without foreign body, left foot, initial encounter: Secondary | ICD-10-CM | POA: Diagnosis not present

## 2016-02-23 DIAGNOSIS — Z7952 Long term (current) use of systemic steroids: Secondary | ICD-10-CM | POA: Diagnosis not present

## 2016-02-23 DIAGNOSIS — Z8659 Personal history of other mental and behavioral disorders: Secondary | ICD-10-CM | POA: Diagnosis not present

## 2016-02-23 DIAGNOSIS — Z23 Encounter for immunization: Secondary | ICD-10-CM | POA: Insufficient documentation

## 2016-02-23 DIAGNOSIS — Z79899 Other long term (current) drug therapy: Secondary | ICD-10-CM | POA: Insufficient documentation

## 2016-02-23 DIAGNOSIS — Z792 Long term (current) use of antibiotics: Secondary | ICD-10-CM | POA: Diagnosis not present

## 2016-02-23 DIAGNOSIS — F17210 Nicotine dependence, cigarettes, uncomplicated: Secondary | ICD-10-CM | POA: Diagnosis not present

## 2016-02-23 DIAGNOSIS — S90812A Abrasion, left foot, initial encounter: Secondary | ICD-10-CM | POA: Insufficient documentation

## 2016-02-23 DIAGNOSIS — S91332A Puncture wound without foreign body, left foot, initial encounter: Secondary | ICD-10-CM | POA: Insufficient documentation

## 2016-02-23 DIAGNOSIS — J45909 Unspecified asthma, uncomplicated: Secondary | ICD-10-CM | POA: Insufficient documentation

## 2016-02-23 DIAGNOSIS — Y92830 Public park as the place of occurrence of the external cause: Secondary | ICD-10-CM | POA: Diagnosis not present

## 2016-02-23 DIAGNOSIS — Z862 Personal history of diseases of the blood and blood-forming organs and certain disorders involving the immune mechanism: Secondary | ICD-10-CM | POA: Diagnosis not present

## 2016-02-23 DIAGNOSIS — Z8669 Personal history of other diseases of the nervous system and sense organs: Secondary | ICD-10-CM | POA: Diagnosis not present

## 2016-02-23 MED ORDER — AMOXICILLIN-POT CLAVULANATE 875-125 MG PO TABS
1.0000 | ORAL_TABLET | Freq: Once | ORAL | Status: AC
  Administered 2016-02-23: 1 via ORAL
  Filled 2016-02-23: qty 1

## 2016-02-23 MED ORDER — TRAMADOL HCL 50 MG PO TABS: 50.0000 mg | ORAL_TABLET | Freq: Four times a day (QID) | ORAL | Status: DC | PRN

## 2016-02-23 MED ORDER — AMOXICILLIN-POT CLAVULANATE 875-125 MG PO TABS: 1.0000 | ORAL_TABLET | Freq: Two times a day (BID) | ORAL | Status: DC

## 2016-02-23 MED ORDER — RABIES IMMUNE GLOBULIN 150 UNIT/ML IM INJ
20.0000 [IU]/kg | INJECTION | Freq: Once | INTRAMUSCULAR | Status: AC
  Administered 2016-02-23: 1500 [IU] via INTRAMUSCULAR
  Filled 2016-02-23: qty 10

## 2016-02-23 MED ORDER — RABIES VACCINE, PCEC IM SUSR
1.0000 mL | Freq: Once | INTRAMUSCULAR | Status: AC
  Administered 2016-02-23: 1 mL via INTRAMUSCULAR
  Filled 2016-02-23: qty 1

## 2016-02-23 MED ORDER — TRAMADOL HCL 50 MG PO TABS
50.0000 mg | ORAL_TABLET | Freq: Once | ORAL | Status: AC
  Administered 2016-02-23: 50 mg via ORAL
  Filled 2016-02-23: qty 1

## 2016-02-23 NOTE — ED Notes (Signed)
PT does not know the owner of the dog.

## 2016-02-23 NOTE — ED Provider Notes (Signed)
CSN: [id number]     Arrival date & time 02/23/16  1728 History  By signing my name below, I, Tiffany [name], attest that this documentation has been prepared under the direction and in the presence of Danelle BerryLeisa Patrisha Hausmann, PA-C. Electronically Signed: Evon Slackerrance [name], ED Scribe. 02/23/2016. 6:13 PM.    Chief Complaint  Patient presents with  . Animal Bite   The history is provided by the patient. No language interpreter was used.   HPI Comments: Tiffany [name] is a 25 y.o. female who presents to the Emergency Department complaining of dog bite to her her left foot onset today at 4PM. Pt rates the severity of her pain 6/10 and burning. Pt presents with puncture wound to the dorsum of the left foot. The bleeding is controlled. Pt states that this was an unknown dog in the park. Pt states she cleaned the wound with peroxide. Pt denies numbness or tingling. Pt states that her tetanus is UTD.   Past Medical History  Diagnosis Date  . Asthma     Albuterol inhaler prn   . Shortness of breath dyspnea     with exertion or laying straight back  . History of migraine     last one a week ago  . Anemia     hx of  . Anxiety     not on any meds  . Insomnia     doesn't take any meds   Past Surgical History  Procedure Laterality Date  . Wisdom tooth extraction  2009  . Lipoma excision Left 03/14/2015    Procedure: EXCISION OF LEFT ANTECUBITAL PHLEBOLITH;  Surgeon: Manus RuddMatthew Tsuei, MD;  Location: [facility] OR;  Service: General;  Laterality: Left;   Family History  Problem Relation Age of Onset  . Other Neg Hx   . Alcohol abuse Neg Hx   . Arthritis Neg Hx   . Birth defects Neg Hx   . Cancer Neg Hx   . COPD Neg Hx   . Depression Neg Hx   . Diabetes Neg Hx   . Drug abuse Neg Hx   . Early death Neg Hx   . Hearing loss Neg Hx   . Heart disease Neg Hx   . Hyperlipidemia Neg Hx   . Hypertension Neg Hx   . Kidney disease Neg Hx   . Learning disabilities Neg Hx   . Mental illness Neg Hx   . Mental  retardation Neg Hx   . Miscarriages / Stillbirths Neg Hx   . Stroke Neg Hx   . Vision loss Neg Hx   . Asthma Sister   . Asthma Brother   . Asthma Other   . Asthma Other    Social History  Substance Use Topics  . Smoking status: Current Every Day Smoker -- 0.10 packs/day for 4 years    Types: Cigars, Cigarettes  . Smokeless tobacco: Never Used     Comment: 2 cigs/day  . Alcohol Use: No   OB History    Gravida Para Term Preterm AB TAB SAB Ectopic Multiple Living   1 1 1  0 0 0 0 0 0 1      Review of Systems  Skin: Positive for wound.  Neurological: Negative for numbness.  All other systems reviewed and are negative.    Allergies  Review of patient's allergies indicates no known allergies.  Home Medications   Prior to Admission medications   Medication Sig Start Date End Date Taking? Authorizing Provider  albuterol (PROVENTIL HFA;VENTOLIN HFA)  108 (90 BASE) MCG/ACT inhaler Inhale 2 puffs into the lungs every 4 (four) hours as needed for wheezing or shortness of breath. 06/05/15   Shon Baton, MD  amoxicillin-clavulanate (AUGMENTIN) 875-125 MG tablet Take 1 tablet by mouth every 12 (twelve) hours. 02/23/16   Danelle Berry, PA-C  azithromycin (ZITHROMAX) 250 MG tablet Take 1 tablet (250 mg total) by mouth daily. Take first 2 tablets together, then 1 every day until finished. 06/11/15   Eber Hong, MD  benzonatate (TESSALON) 100 MG capsule Take 1 capsule (100 mg total) by mouth every 8 (eight) hours. 06/11/15   Eber Hong, MD  HYDROcodone-acetaminophen (NORCO/VICODIN) 5-325 MG per tablet Take 1 tablet by mouth every 4 (four) hours as needed. 03/14/15   Manus Rudd, MD  loperamide (IMODIUM) 2 MG capsule Take 1 capsule (2 mg total) by mouth as needed for diarrhea or loose stools. 06/05/15   Shon Baton, MD  ondansetron (ZOFRAN ODT) 4 MG disintegrating tablet Take 1 tablet (4 mg total) by mouth every 8 (eight) hours as needed for nausea or vomiting. 06/05/15   Shon Baton, MD  predniSONE (DELTASONE) 20 MG tablet Take 2 tablets (40 mg total) by mouth daily with breakfast. 06/05/15   Shon Baton, MD  traMADol (ULTRAM) 50 MG tablet Take 1 tablet (50 mg total) by mouth every 6 (six) hours as needed. 02/23/16   Danelle Berry, PA-C   BP 121/85 mmHg  Pulse 82  Temp(Src) 98.9 F (37.2 C) (Oral)  Resp 18  Ht  (1.626 m)  Wt 73.936 kg  BMI 27.97 kg/m2  SpO2 100%   Physical Exam  Constitutional: She is oriented to person, place, and time. She appears well-developed and well-nourished. No distress.  HENT:  Head: Normocephalic and atraumatic.  Right Ear: External ear normal.  Left Ear: External ear normal.  Nose: Nose normal.  Mouth/Throat: Oropharynx is clear and moist. No oropharyngeal exudate.  Eyes: Conjunctivae and EOM are normal. Pupils are equal, round, and reactive to light. Right eye exhibits no discharge. Left eye exhibits no discharge. No scleral icterus.  Neck: Normal range of motion. Neck supple. No JVD present. No tracheal deviation present.  Cardiovascular: Normal rate and regular rhythm.   Pulmonary/Chest: Effort normal and breath sounds normal. No stridor. No respiratory distress.  Musculoskeletal: Normal range of motion. She exhibits tenderness. She exhibits no edema.       Left ankle: Normal.       Right foot: There is tenderness, swelling and laceration. There is normal range of motion, no bony tenderness, normal capillary refill, no crepitus and no deformity.       Feet:  See picture No injury to plantar aspect of left foot  Lymphadenopathy:    She has no cervical adenopathy.  Neurological: She is alert and oriented to person, place, and time. She exhibits normal muscle tone. Coordination normal.  Skin: Skin is warm. Abrasion and laceration noted. No bruising, no ecchymosis and no rash noted. She is not diaphoretic. There is erythema. No cyanosis. No pallor. Nails show no clubbing.  Multiple small abrasions and puncture wounds  to left dorsum of foot over 4th-5th distal metatarsals.  Localized edema, erythema and tenderness.  No gaping.  No active bleeding or discharge.    Psychiatric: She has a normal mood and affect. Her behavior is normal. Judgment and thought content normal.  Nursing note and vitals reviewed.     ED Course  Procedures (including critical care time) DIAGNOSTIC STUDIES:  Oxygen Saturation is 100% on RA, normal by my interpretation.    COORDINATION OF CARE: 6:12 PM-Discussed treatment plan with pt at bedside and pt agreed to plan.     Labs Review Labs Reviewed - No data to display  Imaging Review Dg Foot Complete Left  02/23/2016  CLINICAL DATA:  Dog bite to left foot at the park today. Visible puncture wound and swelling to anterior foot near toes and lateral side. EXAM: LEFT FOOT - COMPLETE 3+ VIEW COMPARISON:  None. FINDINGS: There is no evidence of fracture or dislocation. There is no evidence of arthropathy or other focal bone abnormality. Soft tissues are unremarkable. No radiopaque foreign body. IMPRESSION: Negative. Electronically Signed   By: Amie Portland M.D.   On: 02/23/2016 19:15      EKG Interpretation None      MDM   Tiffany Reining presents with small laceration/abrasion/puncture wound on left dorsum of foot from a dog bite.  Pt wounds irrigated well with 18ga angiocath with sterile saline.  Wounds examined with visualization of the base and no foreign bodies seen.  Pt Alert and oriented, NAD, nontoxic, nonseptic appearing.  Capillary refill intact and pt without neurologic deficit.  X-rays obtained, no radiopaque foreign body.  Patient tetanus was up to date.  Patient rabies vaccine and immunoglobulin given. Pain treated in the emergency department. Wounds not closed secondary to concern for infection. We'll discharge home with pain medication, Augmentin and requests for close follow-up with PCP or back in the ER.   Medications  traMADol (ULTRAM) tablet 50 mg (50 mg  Oral Given 02/23/16 1929)  rabies immune globulin (HYPERAB) injection 1,500 Units (1,500 Units Intramuscular Given 02/23/16 1935)  rabies vaccine (RABAVERT) injection 1 mL (1 mL Intramuscular Given 02/23/16 1935)  amoxicillin-clavulanate (AUGMENTIN) 875-125 MG per tablet 1 tablet (1 tablet Oral Given 02/23/16 1929)   Pt d/c in good condition, ambulatory, VSS. Filed Vitals:   02/23/16 1735 02/23/16 1750 02/23/16 1913 02/23/16 1959  BP: 118/68   121/85  Pulse: 106   82  Temp: 98.9 F (37.2 C)   98.9 F (37.2 C)  TempSrc: Oral   Oral  Resp: 18   18  Height:  (1.626 m)     Weight: 74.844 kg 73.965 kg 73.936 kg   SpO2: 100%   100%     Final diagnoses:  Dog bite of left foot, initial encounter    I personally performed the services described in this documentation, which was scribed in my presence. The recorded information has been reviewed and is accurate.        Danelle Berry, PA-C 02/23/16 2104  Rolland Porter, MD 02/26/16 [phone]

## 2016-02-23 NOTE — Discharge Instructions (Signed)
Animal Bite Animal bites can range from mild to serious. An animal bite can result in a scratch on the skin, a deep open cut, a puncture of the skin, a crush injury, or tearing away of the skin or a body part. A small bite from a house pet will usually not cause serious problems. However, some animal bites can become infected or injure a bone or other tissue.  Bites from certain animals can be more dangerous because of the risk of spreading rabies, which is a serious viral infection. This risk is higher with bites from stray animals or wild animals, such as raccoons, foxes, skunks, and bats. Dogs are responsible for most animal bites. Children are bitten more often than adults. SYMPTOMS  Common symptoms of an animal bite include:   Pain.   Bleeding.   Swelling.   Bruising.  DIAGNOSIS  This condition may be diagnosed based on a physical exam and medical history. Your health care provider will examine the wound and ask for details about the animal and how the bite happened. You may also have tests, such as:   Blood tests to check for infection or to determine if surgery is needed.  X-rays to check for damage to bones or joints.  Culture test. This uses a sample of fluid from the wound to check for infection. TREATMENT  Treatment varies depending on the location and type of animal bite and your medical history. Treatment may include:   Wound care. This often includes cleaning the wound, flushing the wound with saline solution, and applying a bandage (dressing). Sometimes, the wound is left open to heal because of the high risk of infection. However, in some cases, the wound may be closed with stitches (sutures), staples, skin glue, or adhesive strips.   Antibiotic medicine.   Tetanus shot.   Rabies treatment if the animal could have rabies.  In some cases, bites that have become infected may require IV antibiotics and surgical treatment in the hospital.  [place]  Follow instructions from your health care provider about how to take care of your wound. Make sure you:  Wash your hands with soap and water before you change your dressing. If soap and water are not available, use hand sanitizer.  Change your dressing as told by your health care provider.  Leave sutures, skin glue, or adhesive strips in place. These skin closures may need to be in place for 2 weeks or longer. If adhesive strip edges start to loosen and curl up, you may trim the loose edges. Do not remove adhesive strips completely unless your health care provider tells you to do that.  Check your wound every day for signs of infection. Watch for:   Increasing redness, swelling, or pain.   Fluid, blood, or pus.  General Instructions  Take or apply over-the-counter and prescription medicines only as told by your health care provider.   If you were prescribed an antibiotic, take or apply it as told by your health care provider. Do not stop using the antibiotic even if your condition improves.   Keep the injured area raised (elevated) above the level of your heart while you are sitting or lying down, if this is possible.   If directed, apply ice to the injured area.   Put ice in a plastic bag.   Place a towel between your skin and the bag.   Leave the ice on for 20 minutes, 2-3 times per day.  Keep all follow-up visits as told by your health care provider. This is important.  SEEK MEDICAL CARE IF:  You have increasing redness, swelling, or pain at the site of your wound.   You have a general feeling of sickness (malaise).   You feel nauseous or you vomit.   You have pain that does not get better.  SEEK IMMEDIATE MEDICAL CARE IF:  You have a red streak extending away from your wound.   You have fluid, blood, or pus coming from your wound.   You have a fever or chills.   You have trouble moving your injured area.   You  have numbness or tingling extending beyond the wound.   This information is not intended to replace advice given to you by your health care provider. Make sure you discuss any questions you have with your health care provider.   Document Released: 08/04/2011 Document Revised: 08/07/2015 Document Reviewed: 04/03/2015 Elsevier Interactive Patient Education 2016 [place] is a viral infection that can be spread to people from infected animals. The infection affects the brain and central nervous system. Once the disease develops, it almost always causes death. Because of this, when a person is bitten by an animal that may have rabies, treatment to prevent rabies often needs to be started whether or not the animal is known to be infected. Prompt treatment with the rabies vaccine and rabies immune globulin is very effective at preventing the infection from developing in people who have been exposed to the rabies virus. CAUSES  Rabies is caused by a virus that lives inside some animals. When a person is bitten by an infected animal, the rabies virus is spread to the person through the infected spit (saliva) of the animal. This virus can be carried by animals such as dogs, cats, skunks, bats, woodchucks, raccoons, coyotes, and foxes. SYMPTOMS  By the time symptoms appear, rabies is usually fatal for the person. Common symptoms include:  Headache.  Fever.  Fatigue and weakness.  Agitation.  Anxiety.  Confusion.  Unusual behavior, such as hyperactivity, fear of water (hydrophobia), or fear of air (aerophobia).  Hallucinations.  Insomnia.  Weakness in the arms or legs.  Difficulty swallowing. Most people get sick in 1-3 months after being bitten. This often varies and may depend on the location of the bite. The infection will take less time to develop if the bite occurred closer to the head.  DIAGNOSIS  To determine if a person is infected, several tests must be  performed, such as:  A skin biopsy.  A saliva test.  A lumbar puncture to remove spinal fluid so it can be examined.  Blood tests. TREATMENT  Treatment to prevent the infection from developing (post-exposure prophylaxis, PEP) is often started before knowing for sure if the person has been exposed to the rabies virus. PEP involves cleaning the wound, giving an antibody injection (rabies immune globulin), and giving a series of rabies vaccine injections. The series of injections are usually given over a two-week period. If possible, the animal that bit the person will be observed to see if it remains healthy. If the animal has been killed, it can be sent to a state laboratory and examined to see if the animal had rabies. If a person is bitten by a domestic animal (dog, cat, or ferret) that appears healthy and can be observed to see if it remains healthy, often no further treatment is necessary other than care of the wounds caused  by the animal. Rabies is often a fatal illness once the infection develops in a person. Although a few people who developed rabies have survived after experimental treatment with certain drugs, all these survivors still had severe nervous system problems after the treatment. This is why caregivers use extra caution and begin PEP treatment for people who have been bitten by animals that are possibly infected with rabies.  HOME CARE INSTRUCTIONS  If you were bitten by an unknown animal, make sure you know your caregiver's instructions for follow-up. If the animal was sent to a laboratory for examination, ask when the test results will be ready. Make sure you get the test results.  Take these steps to care for your wound:  Keep the wound clean, dry, and dressed as directed by your caregiver.  Keep the injured part elevated as much as possible.  Do not resume use of the affected area until directed.  Only take over-the-counter or prescription medicines as directed by your  caregiver.  Keep all follow-up appointments as directed by your caregiver. PREVENTION  To prevent rabies, people need to reduce their risk of having contact with infected animals.   Make sure your pets (dogs, cats, ferrets) are vaccinated against rabies. Keep these vaccinations up-to-date as directed by your veterinarian.  Supervise your pets when they are outside. Keep them away from wild animals.  Call your local animal control services to report any stray animals. These animals may not be vaccinated.  Stay away from stray or wild animals.  Consider getting the rabies vaccine (preexposure) if you are traveling to an area where rabies is common or if your job or activities involve possible contact with wild or stray animals. Discuss this with your caregiver.   This information is not intended to replace advice given to you by your health care provider. Make sure you discuss any questions you have with your health care provider.   Document Released: 11/16/2005 Document Revised: 12/07/2014 Document Reviewed: 06/14/2012 Elsevier Interactive Patient Education [employer] Elsevier Inc[employer]

## 2016-02-23 NOTE — ED Notes (Signed)
[place] the  [place] a dog bite her LT foot.

## 2016-02-26 ENCOUNTER — Encounter (HOSPITAL_COMMUNITY): Payer: Self-pay | Admitting: Emergency Medicine

## 2016-02-26 ENCOUNTER — Emergency Department (INDEPENDENT_AMBULATORY_CARE_PROVIDER_SITE_OTHER)
Admission: EM | Admit: 2016-02-26 | Discharge: 2016-02-26 | Disposition: A | Discharge status: Home / Self Care | Payer: Medicaid Other | Source: Home / Self Care

## 2016-02-26 DIAGNOSIS — Z23 Encounter for immunization: Secondary | ICD-10-CM | POA: Diagnosis not present

## 2016-02-26 MED ORDER — RABIES VACCINE, PCEC IM SUSR
1.0000 mL | Freq: Once | INTRAMUSCULAR | Status: AC
  Administered 2016-02-26: 1 mL via INTRAMUSCULAR

## 2016-02-26 MED ORDER — RABIES VACCINE, PCEC IM SUSR
INTRAMUSCULAR | Status: AC
  Filled 2016-02-26: qty 1

## 2016-02-26 NOTE — ED Notes (Signed)
[facility] with a complaint of getting the second in her series of rabies vaccines. [facility].

## 2016-03-02 ENCOUNTER — Encounter (HOSPITAL_COMMUNITY): Payer: Self-pay | Admitting: *Deleted

## 2016-03-02 ENCOUNTER — Emergency Department (INDEPENDENT_AMBULATORY_CARE_PROVIDER_SITE_OTHER)
Admission: EM | Admit: 2016-03-02 | Discharge: 2016-03-02 | Disposition: A | Discharge status: Home / Self Care | Payer: Medicaid Other | Source: Home / Self Care

## 2016-03-02 DIAGNOSIS — Z203 Contact with and (suspected) exposure to rabies: Secondary | ICD-10-CM | POA: Diagnosis not present

## 2016-03-02 MED ORDER — RABIES VACCINE, PCEC IM SUSR
1.0000 mL | Freq: Once | INTRAMUSCULAR | Status: AC
  Administered 2016-03-02: 1 mL via INTRAMUSCULAR

## 2016-03-02 MED ORDER — RABIES VACCINE, PCEC IM SUSR
INTRAMUSCULAR | Status: AC
  Filled 2016-03-02: qty 1

## 2016-03-02 NOTE — ED Notes (Signed)
Pt    Is  Here  For  The next   In  Her   Series  Of  Rabies  Vaccines      She      Verbalizes  No complaints

## 2016-03-02 NOTE — Discharge Instructions (Signed)
Return  As  Directed for your  Next  Shot in the  Rabies   Series       -  Return  Sooner  If  Any  Problems

## 2016-03-09 ENCOUNTER — Ambulatory Visit (HOSPITAL_COMMUNITY)
Admission: EM | Admit: 2016-03-09 | Discharge: 2016-03-09 | Disposition: A | Discharge status: Home / Self Care | Payer: Medicaid Other | Attending: Emergency Medicine | Admitting: Emergency Medicine

## 2016-03-09 DIAGNOSIS — Z203 Contact with and (suspected) exposure to rabies: Secondary | ICD-10-CM | POA: Diagnosis not present

## 2016-03-09 MED ORDER — RABIES VACCINE, PCEC IM SUSR
1.0000 mL | Freq: Once | INTRAMUSCULAR | Status: AC
  Administered 2016-03-09: 1 mL via INTRAMUSCULAR

## 2016-03-09 MED ORDER — RABIES VACCINE, PCEC IM SUSR
INTRAMUSCULAR | Status: AC
  Filled 2016-03-09: qty 1

## 2016-03-09 NOTE — Discharge Instructions (Signed)
Rabies °Rabies is a viral infection that can be spread to people from infected animals. The infection affects the brain and central nervous system. Once the disease develops, it almost always causes death. Because of this, when a person is bitten by an animal that may have rabies, treatment to prevent rabies often needs to be started whether or not the animal is known to be infected. Prompt treatment with the rabies vaccine and rabies immune globulin is very effective at preventing the infection from developing in people who have been exposed to the rabies virus. °CAUSES  °Rabies is caused by a virus that lives inside some animals. When a person is bitten by an infected animal, the rabies virus is spread to the person through the infected spit (saliva) of the animal. This virus can be carried by animals such as dogs, cats, skunks, bats, woodchucks, raccoons, coyotes, and foxes. °SYMPTOMS  °By the time symptoms appear, rabies is usually fatal for the person. Common symptoms include: °· Headache. °· Fever. °· Fatigue and weakness. °· Agitation. °· Anxiety. °· Confusion. °· Unusual behavior, such as hyperactivity, fear of water (hydrophobia), or fear of air (aerophobia). °· Hallucinations. °· Insomnia. °· Weakness in the arms or legs. °· Difficulty swallowing. °Most people get sick in 1-3 months after being bitten. This often varies and may depend on the location of the bite. The infection will take less time to develop if the bite occurred closer to the head.  °DIAGNOSIS  °To determine if a person is infected, several tests must be performed, such as: °· A skin biopsy. °· A saliva test. °· A lumbar puncture to remove spinal fluid so it can be examined. °· Blood tests. °TREATMENT  °Treatment to prevent the infection from developing (post-exposure prophylaxis, PEP) is often started before knowing for sure if the person has been exposed to the rabies virus. PEP involves cleaning the wound, giving an antibody injection  (rabies immune globulin), and giving a series of rabies vaccine injections. The series of injections are usually given over a two-week period. If possible, the animal that bit the person will be observed to see if it remains healthy. If the animal has been killed, it can be sent to a state laboratory and examined to see if the animal had rabies. °If a person is bitten by a domestic animal (dog, cat, or ferret) that appears healthy and can be observed to see if it remains healthy, often no further treatment is necessary other than care of the wounds caused by the animal. °Rabies is often a fatal illness once the infection develops in a person. Although a few people who developed rabies have survived after experimental treatment with certain drugs, all these survivors still had severe nervous system problems after the treatment. This is why caregivers use extra caution and begin PEP treatment for people who have been bitten by animals that are possibly infected with rabies.  °HOME CARE INSTRUCTIONS  °If you were bitten by an unknown animal, make sure you know your caregiver's instructions for follow-up. If the animal was sent to a laboratory for examination, ask when the test results will be ready. Make sure you get the test results.  °Take these steps to care for your wound: °· Keep the wound clean, dry, and dressed as directed by your caregiver. °· Keep the injured part elevated as much as possible. °· Do not resume use of the affected area until directed. °· Only take over-the-counter or prescription medicines as directed by your   caregiver. °· Keep all follow-up appointments as directed by your caregiver. °PREVENTION  °To prevent rabies, people need to reduce their risk of having contact with infected animals.  °· Make sure your pets (dogs, cats, ferrets) are vaccinated against rabies. Keep these vaccinations up-to-date as directed by your veterinarian. °· Supervise your pets when they are outside. Keep them away  from wild animals. °· Call your local animal control services to report any stray animals. These animals may not be vaccinated. °· Stay away from stray or wild animals. °· Consider getting the rabies vaccine (preexposure) if you are traveling to an area where rabies is common or if your job or activities involve possible contact with wild or stray animals. Discuss this with your caregiver. °  °This information is not intended to replace advice given to you by your health care provider. Make sure you discuss any questions you have with your health care provider. °  °Document Released: 11/16/2005 Document Revised: 12/07/2014 Document Reviewed: 06/14/2012 °Elsevier Interactive Patient Education ©2016 Elsevier Inc. ° °

## 2016-11-10 ENCOUNTER — Emergency Department (HOSPITAL_COMMUNITY)
Admission: EM | Admit: 2016-11-10 | Discharge: 2016-11-10 | Disposition: A | Discharge status: Home / Self Care | Payer: Medicaid Other | Attending: Emergency Medicine | Admitting: Emergency Medicine

## 2016-11-10 ENCOUNTER — Encounter (HOSPITAL_COMMUNITY): Payer: Self-pay | Admitting: *Deleted

## 2016-11-10 DIAGNOSIS — R599 Enlarged lymph nodes, unspecified: Secondary | ICD-10-CM | POA: Insufficient documentation

## 2016-11-10 DIAGNOSIS — J45909 Unspecified asthma, uncomplicated: Secondary | ICD-10-CM | POA: Insufficient documentation

## 2016-11-10 DIAGNOSIS — F17210 Nicotine dependence, cigarettes, uncomplicated: Secondary | ICD-10-CM | POA: Diagnosis not present

## 2016-11-10 DIAGNOSIS — M79601 Pain in right arm: Secondary | ICD-10-CM | POA: Diagnosis present

## 2016-11-10 MED ORDER — NAPROXEN 375 MG PO TABS: 375.0000 mg | ORAL_TABLET | Freq: Two times a day (BID) | ORAL | 0 refills | Status: DC

## 2016-11-10 NOTE — ED Triage Notes (Signed)
Pt reports left arm pain, had surgery in April and reports having pain since then. Also has abscess under her right arm that is causing pain, denies fever.

## 2016-11-10 NOTE — ED Provider Notes (Signed)
[facility]-EMERGENCY DEPT Provider Note   CSN: [id number] Arrival date & time: 11/10/16  [id number]  By signing my name below, I, Emmanuella Mensah, attest that this documentation has been prepared under the direction and in the presence of Felicie Mornavid Kaiah Hosea, NP. Electronically Signed: Angelene GiovanniEmmanuella Mensah, ED Scribe. 11/10/16. 7:35 PM.   History   Chief Complaint Chief Complaint  Patient presents with  . Abscess  . Arm Pain    HPI Comments: Tiffany [name] is a 25 y.o. female who presents to the Emergency Department complaining of gradually worsening moderately burning area of swelling to her right axilla onset 2 months ago. She reports associated intermittent chills onset 3 months ago. She denies any known injuries or trauma to her right axilla. Pt has not tried any medications PTA. She has NKDA. She denies any fever, abdominal pain, nausea, vomiting, or any other symptoms.   PCP at [facility]   The history is provided by the patient. No language interpreter was used.    Past Medical History:  Diagnosis Date  . Anemia    hx of  . Anxiety    not on any meds  . Asthma    Albuterol inhaler prn   . History of migraine    last one a week ago  . Insomnia    doesn't take any meds  . Shortness of breath dyspnea    with exertion or laying straight back    Patient Active Problem List   Diagnosis Date Noted  . Mild preeclampsia delivered 03/09/2013  . Vaginal delivery 03/08/2013  . Active labor 03/07/2013  . Anemia 03/07/2013  . 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 Cigarette smoker 02/10/2013  . Pregnant state, incidental 02/10/2013    Past Surgical History:  Procedure Laterality Date  . LIPOMA EXCISION Left 03/14/2015   Procedure: EXCISION OF LEFT ANTECUBITAL PHLEBOLITH;  Surgeon: Manus RuddMatthew Tsuei, MD;  Location: [facility] OR;  Service: General;  Laterality: Left;  . WISDOM TOOTH EXTRACTION  2009    OB History    Gravida Para Term Preterm AB Living   1 1 1  0 0 1   SAB TAB Ectopic Multiple Live Births   0 0 0 0 1       Home Medications    Prior to Admission medications   Medication Sig Start Date End Date Taking? Authorizing Provider  albuterol (PROVENTIL HFA;VENTOLIN HFA) 108 (90 BASE) MCG/ACT inhaler Inhale 2 puffs into the lungs every 4 (four) hours as needed for wheezing or shortness of breath. 06/05/15   Shon Batonourtney F Horton, MD  amoxicillin-clavulanate (AUGMENTIN) 875-125 MG tablet Take 1 tablet by mouth every 12 (twelve) hours. 02/23/16   Danelle BerryLeisa Tapia, PA-C  azithromycin (ZITHROMAX) 250 MG tablet Take 1 tablet (250 mg total) by mouth daily. Take first 2 tablets together, then 1 every day until finished. 06/11/15   Eber HongBrian Miller, MD  benzonatate (TESSALON) 100 MG capsule Take 1 capsule (100 mg total) by mouth every 8 (eight) hours. 06/11/15   Eber HongBrian Miller, MD  HYDROcodone-acetaminophen (NORCO/VICODIN) 5-325 MG per tablet Take 1 tablet by mouth every 4 (four) hours as needed. 03/14/15   Manus RuddMatthew Tsuei, MD  loperamide (IMODIUM) 2 MG capsule Take 1 capsule (2 mg total) by mouth as needed for diarrhea or loose stools. 06/05/15   Shon Batonourtney F Horton, MD  ondansetron (ZOFRAN ODT) 4 MG disintegrating tablet Take 1 tablet (4 mg total) by mouth every 8 (eight) hours as needed for nausea or vomiting. 06/05/15   Toni Amendourtney  F Horton, MD  predniSONE (DELTASONE) 20 MG tablet Take 2 tablets (40 mg total) by mouth daily with breakfast. 06/05/15   Shon Batonourtney F Horton, MD  traMADol (ULTRAM) 50 MG tablet Take 1 tablet (50 mg total) by mouth every 6 (six) hours as needed. 02/23/16   Danelle BerryLeisa Tapia, PA-C    Family History Family History  Problem Relation Age of Onset  . Other Neg Hx   . Alcohol abuse Neg Hx   . Arthritis Neg Hx   . Birth defects Neg Hx   . Cancer Neg Hx   . COPD Neg Hx   . Depression Neg Hx   . Diabetes Neg Hx   . Drug abuse Neg Hx   . Early death Neg Hx   . Hearing loss Neg Hx   . Heart disease Neg Hx   . Hyperlipidemia Neg Hx   . Hypertension Neg Hx   . Kidney  disease Neg Hx   . Learning disabilities Neg Hx   . Mental illness Neg Hx   . Mental retardation Neg Hx   . Miscarriages / Stillbirths Neg Hx   . Stroke Neg Hx   . Vision loss Neg Hx   . Asthma Sister   . Asthma Brother   . Asthma Other   . Asthma Other     Social History Social History  Substance Use Topics  . Smoking status: Current Every Day Smoker    Packs/day: 0.10    Years: 4.00    Types: Cigars, Cigarettes  . Smokeless tobacco: Never Used     Comment: 2 cigs/day  . Alcohol use No     Allergies   Patient has no known allergies.   Review of Systems Review of Systems  Constitutional: Positive for chills. Negative for fever.  Gastrointestinal: Negative for abdominal pain, nausea and vomiting.  Skin: Positive for wound (area of swelling).  All other systems reviewed and are negative.    Physical Exam Updated Vital Signs BP 156/81 (BP Location: Right Arm)   Pulse 113   Temp 98.1 F (36.7 C) (Oral)   Resp 18   SpO2 98%   Physical Exam  Constitutional: She is oriented to person, place, and time. She appears well-developed and well-nourished. No distress.  HENT:  Head: Normocephalic and atraumatic.  Eyes: Conjunctivae and EOM are normal.  Neck: Neck supple.  Cardiovascular: Normal rate.   Pulmonary/Chest: Effort normal. No respiratory distress.  Musculoskeletal: Normal range of motion.  .5 by .5 cm nodule in right axilla  Neurological: She is alert and oriented to person, place, and time.  Skin: Skin is warm and dry.  Psychiatric: She has a normal mood and affect. Her behavior is normal.  Nursing note and vitals reviewed.    ED Treatments / Results  DIAGNOSTIC STUDIES: Oxygen Saturation is 98% on RA, normal by my interpretation.    COORDINATION OF CARE: 7:33 PM- Pt advised of plan for treatment and pt agrees. Pt will receive NSAIDs. Advised to use warm compresses and to follow up with PCP is area persists despite treatment.    Labs (all labs  ordered are listed, but only abnormal results are displayed) Labs Reviewed - No data to display  EKG  EKG Interpretation None       Radiology No results found.  Procedures Procedures (including critical care time)  Medications Ordered in ED Medications - No data to display   Initial Impression / Assessment and Plan / ED Course  Felicie Mornavid Eniyah Eastmond, NP has  reviewed the triage vital signs and the nursing notes.  Pertinent labs & imaging results that were available during my care of the patient were reviewed by me and considered in my medical decision making (see chart for details).  Clinical Course       At this time there does not appear to be any evidence of an acute emergency medical condition and the patient appears stable for discharge with appropriate outpatient follow up.Diagnosis was discussed with patient who verbalizes understanding and is agreeable to discharge.  Final Clinical Impressions(s) / ED Diagnoses   Final diagnoses:  Lymph node enlargement    New Prescriptions New Prescriptions   No medications on file   I personally performed the services described in this documentation, which was scribed in my presence. The recorded information has been reviewed and is accurate.    Felicie Morn, NP 11/11/16 [id number]    Linwood Dibbles, MD 11/11/16 [id number]

## 2017-01-26 ENCOUNTER — Emergency Department (HOSPITAL_COMMUNITY)
Admission: EM | Admit: 2017-01-26 | Discharge: 2017-01-26 | Disposition: A | Discharge status: Home / Self Care | Payer: Medicaid Other | Attending: Emergency Medicine | Admitting: Emergency Medicine

## 2017-01-26 ENCOUNTER — Ambulatory Visit (HOSPITAL_COMMUNITY): Payer: Medicaid Other

## 2017-01-26 ENCOUNTER — Encounter (HOSPITAL_COMMUNITY): Payer: Self-pay | Admitting: Emergency Medicine

## 2017-01-26 DIAGNOSIS — N83292 Other ovarian cyst, left side: Secondary | ICD-10-CM | POA: Insufficient documentation

## 2017-01-26 DIAGNOSIS — J45909 Unspecified asthma, uncomplicated: Secondary | ICD-10-CM | POA: Diagnosis not present

## 2017-01-26 DIAGNOSIS — N939 Abnormal uterine and vaginal bleeding, unspecified: Secondary | ICD-10-CM | POA: Diagnosis present

## 2017-01-26 DIAGNOSIS — N83209 Unspecified ovarian cyst, unspecified side: Secondary | ICD-10-CM

## 2017-01-26 DIAGNOSIS — F17290 Nicotine dependence, other tobacco product, uncomplicated: Secondary | ICD-10-CM | POA: Diagnosis not present

## 2017-01-26 LAB — CBC
HCT: 38 % (ref 36.0–46.0)
HEMOGLOBIN: 12.7 g/dL (ref 12.0–15.0)
MCH: 31.1 pg (ref 26.0–34.0)
MCHC: 33.4 g/dL (ref 30.0–36.0)
MCV: 93.1 fL (ref 78.0–100.0)
Platelets: 237 10*3/uL (ref 150–400)
RBC: 4.08 MIL/uL (ref 3.87–5.11)
RDW: 13.3 % (ref 11.5–15.5)
WBC: 10.4 10*3/uL (ref 4.0–10.5)

## 2017-01-26 LAB — COMPREHENSIVE METABOLIC PANEL
ALBUMIN: 3.9 g/dL (ref 3.5–5.0)
ALT: 17 U/L (ref 14–54)
ANION GAP: 11 (ref 5–15)
AST: 25 U/L (ref 15–41)
Alkaline Phosphatase: 30 U/L — ABNORMAL LOW (ref 38–126)
BILIRUBIN TOTAL: 0.2 mg/dL — AB (ref 0.3–1.2)
BUN: 9 mg/dL (ref 6–20)
CALCIUM: 9 mg/dL (ref 8.9–10.3)
CO2: 19 mmol/L — AB (ref 22–32)
CREATININE: 0.81 mg/dL (ref 0.44–1.00)
Chloride: 109 mmol/L (ref 101–111)
GFR calc Af Amer: >60 mL/min (ref >60)
GFR calc non Af Amer: >60 mL/min (ref >60)
GLUCOSE: 150 mg/dL — AB (ref 65–99)
Potassium: 3.6 mmol/L (ref 3.5–5.1)
SODIUM: 139 mmol/L (ref 135–145)
Total Protein: 6.6 g/dL (ref 6.5–8.1)

## 2017-01-26 LAB — URINALYSIS, ROUTINE W REFLEX MICROSCOPIC
Bilirubin Urine: NEGATIVE
GLUCOSE, UA: NEGATIVE mg/dL
Ketones, ur: NEGATIVE mg/dL
Leukocytes, UA: NEGATIVE
Nitrite: NEGATIVE
PROTEIN: NEGATIVE mg/dL
SPECIFIC GRAVITY, URINE: 1.021 (ref 1.005–1.030)
pH: 6 (ref 5.0–8.0)

## 2017-01-26 LAB — I-STAT BETA HCG BLOOD, ED (MC, WL, AP ONLY)

## 2017-01-26 LAB — LIPASE, BLOOD: Lipase: 22 U/L (ref 11–51)

## 2017-01-26 NOTE — Discharge Instructions (Signed)
Follow-up with gynecology in the next week.  The contact information for women's outpatient clinic has been provided in this discharge summary for you to call and make these arrangements.  Go to the women's [facility] in the meantime if your symptoms significantly worsen or change.

## 2017-01-26 NOTE — ED Provider Notes (Signed)
[facility]-EMERGENCY DEPT Provider Note   CSN: [id number] Arrival date & time: 01/26/17  1023     History   Chief Complaint Chief Complaint  Patient presents with  . Abdominal Pain  . Vaginal Bleeding    HPI Tiffany [name] is a 26 y.o. female.  Patient is an otherwise healthy 26 year old female who presents with complaints of left lower quadrant pain and vaginal spotting that has been occurring over the past month. The patient receives Depo shot every 3 months and last received this in January. She denies any fevers or chills. She denies any vaginal discharge. She is sexually active with 1 partner, but denies any new partners. She denies the possibility of pregnancy.   The history is provided by the patient.  Abdominal Pain   This is a new problem. Episode onset: One month ago. Episode frequency: Intermittently. The problem has not changed since onset.The pain is located in the LLQ. The quality of the pain is cramping. The pain is moderate. Nothing aggravates the symptoms. Nothing relieves the symptoms.  Vaginal Bleeding  Primary symptoms include vaginal bleeding. Associated symptoms include abdominal pain.    Past Medical History:  Diagnosis Date  . Anemia    hx of  . Anxiety    not on any meds  . Asthma    Albuterol inhaler prn   . History of migraine    last one a week ago  . Insomnia    doesn't take any meds  . Shortness of breath dyspnea    with exertion or laying straight back    Patient Active Problem List   Diagnosis Date Noted  . Mild preeclampsia delivered 03/09/2013  . Vaginal delivery 03/08/2013  . Active labor 03/07/2013  . Anemia 03/07/2013  . 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 Cigarette smoker 02/10/2013  . Pregnant state, incidental 02/10/2013    Past Surgical History:  Procedure Laterality Date  . LIPOMA EXCISION Left 03/14/2015   Procedure: EXCISION OF LEFT ANTECUBITAL PHLEBOLITH;  Surgeon: Manus Rudd, MD;   Location: [facility] OR;  Service: General;  Laterality: Left;  . WISDOM TOOTH EXTRACTION  2009    OB History    Gravida Para Term Preterm AB Living   1 1 1  0 0 1   SAB TAB Ectopic Multiple Live Births   0 0 0 0 1       Home Medications    Prior to Admission medications   Medication Sig Start Date End Date Taking? Authorizing Provider  albuterol (PROVENTIL HFA;VENTOLIN HFA) 108 (90 BASE) MCG/ACT inhaler Inhale 2 puffs into the lungs every 4 (four) hours as needed for wheezing or shortness of breath. Patient not taking: Reported on 01/26/2017 06/05/15   Shon Baton, MD  amoxicillin-clavulanate (AUGMENTIN) 875-125 MG tablet Take 1 tablet by mouth every 12 (twelve) hours. Patient not taking: Reported on 01/26/2017 02/23/16   Danelle Berry, PA-C  azithromycin (ZITHROMAX) 250 MG tablet Take 1 tablet (250 mg total) by mouth daily. Take first 2 tablets together, then 1 every day until finished. Patient not taking: Reported on 01/26/2017 06/11/15   Eber Hong, MD  benzonatate (TESSALON) 100 MG capsule Take 1 capsule (100 mg total) by mouth every 8 (eight) hours. Patient not taking: Reported on 01/26/2017 06/11/15   Eber Hong, MD  HYDROcodone-acetaminophen (NORCO/VICODIN) 5-325 MG per tablet Take 1 tablet by mouth every 4 (four) hours as needed. Patient not taking: Reported on 01/26/2017 03/14/15   Manus Rudd, MD  loperamide (  IMODIUM) 2 MG capsule Take 1 capsule (2 mg total) by mouth as needed for diarrhea or loose stools. Patient not taking: Reported on 01/26/2017 06/05/15   Shon Baton, MD  naproxen (NAPROSYN) 375 MG tablet Take 1 tablet (375 mg total) by mouth 2 (two) times daily. Patient not taking: Reported on 01/26/2017 11/10/16   Felicie Morn, NP  ondansetron (ZOFRAN ODT) 4 MG disintegrating tablet Take 1 tablet (4 mg total) by mouth every 8 (eight) hours as needed for nausea or vomiting. Patient not taking: Reported on 01/26/2017 06/05/15   Shon Baton, MD  predniSONE (DELTASONE) 20 MG  tablet Take 2 tablets (40 mg total) by mouth daily with breakfast. Patient not taking: Reported on 01/26/2017 06/05/15   Shon Baton, MD  traMADol (ULTRAM) 50 MG tablet Take 1 tablet (50 mg total) by mouth every 6 (six) hours as needed. Patient not taking: Reported on 01/26/2017 02/23/16   Danelle Berry, PA-C    Family History Family History  Problem Relation Age of Onset  . Asthma Other   . Asthma Other   . Asthma Sister   . Asthma Brother   . Other Neg Hx   . Alcohol abuse Neg Hx   . Arthritis Neg Hx   . Birth defects Neg Hx   . Cancer Neg Hx   . COPD Neg Hx   . Depression Neg Hx   . Diabetes Neg Hx   . Drug abuse Neg Hx   . Early death Neg Hx   . Hearing loss Neg Hx   . Heart disease Neg Hx   . Hyperlipidemia Neg Hx   . Hypertension Neg Hx   . Kidney disease Neg Hx   . Learning disabilities Neg Hx   . Mental illness Neg Hx   . Mental retardation Neg Hx   . Miscarriages / Stillbirths Neg Hx   . Stroke Neg Hx   . Vision loss Neg Hx     Social History Social History  Substance Use Topics  . Smoking status: Current Every Day Smoker    Packs/day: 0.10    Years: 4.00    Types: Cigars  . Smokeless tobacco: Never Used     Comment: 2 cigs/day  . Alcohol use No     Allergies   Patient has no known allergies.   Review of Systems Review of Systems  Gastrointestinal: Positive for abdominal pain.  Genitourinary: Positive for vaginal bleeding.  All other systems reviewed and are negative.    Physical Exam Updated Vital Signs BP 125/87 (BP Location: Left Arm)   Pulse 87   Temp 98.7 F (37.1 C) (Oral)   Resp 16   Ht 5\' 3"  (1.6 m)   Wt 154 lb (69.9 kg)   LMP 12/29/2016 (Approximate)   SpO2 100%   BMI 27.28 kg/m   Physical Exam  Constitutional: She is oriented to person, place, and time. She appears well-developed and well-nourished. No distress.  HENT:  Head: Normocephalic and atraumatic.  Neck: Normal range of motion. Neck supple.  Cardiovascular:  Normal rate and regular rhythm.  Exam reveals no gallop and no friction rub.   No murmur heard. Pulmonary/Chest: Effort normal and breath sounds normal. No respiratory distress. She has no wheezes.  Abdominal: Soft. Bowel sounds are normal. She exhibits no distension. There is tenderness. There is rebound.  There is mild tenderness in the left lower quadrant.  Musculoskeletal: Normal range of motion.  Neurological: She is alert and oriented to person,  place, and time.  Skin: Skin is warm and dry. She is not diaphoretic.  Nursing note and vitals reviewed.    ED Treatments / Results  Labs (all labs ordered are listed, but only abnormal results are displayed) Labs Reviewed  COMPREHENSIVE METABOLIC PANEL - Abnormal; Notable for the following:       Result Value   CO2 19 (*)    Glucose, Bld 150 (*)    Alkaline Phosphatase 30 (*)    Total Bilirubin 0.2 (*)    All other components within normal limits  LIPASE, BLOOD  CBC  URINALYSIS, ROUTINE W REFLEX MICROSCOPIC  I-STAT BETA HCG BLOOD, ED ([facility], [facility], [facility] ONLY)    EKG  EKG Interpretation None       Radiology No results found.  Procedures Procedures (including critical care time)  Medications Ordered in ED Medications - No data to display   Initial Impression / Assessment and Plan / ED Course  I have reviewed the triage vital signs and the nursing notes.  Pertinent labs & imaging results that were available during my care of the patient were reviewed by me and considered in my medical decision making (see chart for details).  Ultrasound reveals free fluid in the pelvis with a small cystic structure in the left adnexa. Her pregnancy test is negative, thus ruling out ectopic. This is most likely a hemorrhagic ovarian cyst. Her blood counts are stable and she is otherwise nontoxic appearing. I believe she is appropriate for discharge, to follow-up with her GYN.  Final Clinical Impressions(s) / ED Diagnoses   Final diagnoses:    None    New Prescriptions New Prescriptions   No medications on file     Geoffery Lyonsouglas Marcos Ruelas, MD 01/26/17 1440

## 2017-01-26 NOTE — ED Notes (Signed)
Patient transported to Ultrasound 

## 2017-01-26 NOTE — ED Notes (Signed)
Pt ambulated to room from waiting area. Pt denied pain, and had no other complaints.

## 2017-01-26 NOTE — ED Triage Notes (Signed)
Pt from home with c/o left side pain and bilateral lower back pain starting approx 4 weeks ago.  Pt reports intermittent vaginal bleeding x 3 weeks.  Denies possibility of pregnancy due to depo shot or possible STD exposure.  NAD, A&O.

## 2017-02-24 ENCOUNTER — Other Ambulatory Visit: Payer: Self-pay | Admitting: Family

## 2017-02-24 DIAGNOSIS — N83209 Unspecified ovarian cyst, unspecified side: Secondary | ICD-10-CM

## 2017-03-02 ENCOUNTER — Ambulatory Visit
Admission: RE | Admit: 2017-03-02 | Discharge: 2017-03-02 | Disposition: A | Discharge status: Home / Self Care | Payer: Medicaid Other | Source: Ambulatory Visit | Attending: Family | Admitting: Family

## 2017-03-02 DIAGNOSIS — N83209 Unspecified ovarian cyst, unspecified side: Secondary | ICD-10-CM

## 2017-04-16 ENCOUNTER — Encounter (HOSPITAL_COMMUNITY): Payer: Self-pay

## 2017-04-16 DIAGNOSIS — R1012 Left upper quadrant pain: Secondary | ICD-10-CM | POA: Diagnosis not present

## 2017-04-16 DIAGNOSIS — J45909 Unspecified asthma, uncomplicated: Secondary | ICD-10-CM | POA: Insufficient documentation

## 2017-04-16 DIAGNOSIS — Z79899 Other long term (current) drug therapy: Secondary | ICD-10-CM | POA: Insufficient documentation

## 2017-04-16 DIAGNOSIS — F17290 Nicotine dependence, other tobacco product, uncomplicated: Secondary | ICD-10-CM | POA: Insufficient documentation

## 2017-04-16 LAB — CBC
HCT: 39.4 % (ref 36.0–46.0)
Hemoglobin: 13.7 g/dL (ref 12.0–15.0)
MCH: 32.1 pg (ref 26.0–34.0)
MCHC: 34.8 g/dL (ref 30.0–36.0)
MCV: 92.3 fL (ref 78.0–100.0)
PLATELETS: 223 10*3/uL (ref 150–400)
RBC: 4.27 MIL/uL (ref 3.87–5.11)
RDW: 13.3 % (ref 11.5–15.5)
WBC: 11.6 10*3/uL — ABNORMAL HIGH (ref 4.0–10.5)

## 2017-04-16 LAB — URINALYSIS, ROUTINE W REFLEX MICROSCOPIC
Bilirubin Urine: NEGATIVE
Glucose, UA: NEGATIVE mg/dL
KETONES UR: NEGATIVE mg/dL
Leukocytes, UA: NEGATIVE
Nitrite: NEGATIVE
PH: 5 (ref 5.0–8.0)
Protein, ur: NEGATIVE mg/dL
SPECIFIC GRAVITY, URINE: 1.025 (ref 1.005–1.030)

## 2017-04-16 LAB — COMPREHENSIVE METABOLIC PANEL
ALT: 17 U/L (ref 14–54)
AST: 22 U/L (ref 15–41)
Albumin: 3.9 g/dL (ref 3.5–5.0)
Alkaline Phosphatase: 39 U/L (ref 38–126)
Anion gap: 8 (ref 5–15)
BUN: 8 mg/dL (ref 6–20)
CHLORIDE: 105 mmol/L (ref 101–111)
CO2: 21 mmol/L — AB (ref 22–32)
CREATININE: 0.71 mg/dL (ref 0.44–1.00)
Calcium: 8.9 mg/dL (ref 8.9–10.3)
GFR calc Af Amer: >60 mL/min (ref >60)
GLUCOSE: 94 mg/dL (ref 65–99)
Potassium: 3.7 mmol/L (ref 3.5–5.1)
SODIUM: 134 mmol/L — AB (ref 135–145)
Total Bilirubin: 1.3 mg/dL — ABNORMAL HIGH (ref 0.3–1.2)
Total Protein: 6.7 g/dL (ref 6.5–8.1)

## 2017-04-16 LAB — I-STAT BETA HCG BLOOD, ED (MC, WL, AP ONLY)

## 2017-04-16 LAB — LIPASE, BLOOD: LIPASE: 27 U/L (ref 11–51)

## 2017-04-16 NOTE — ED Triage Notes (Signed)
Pt reports LUQ abdominal pain that started this morning associated with weakness, nausea, lack of appetite and trouble sleeping.

## 2017-04-16 NOTE — ED Notes (Signed)
Pt drinking can of ginger ale during triage. Pt advised to not drink or eat anything until cleared by EDP. Pt stated "okay."

## 2017-04-17 ENCOUNTER — Emergency Department (HOSPITAL_COMMUNITY)
Admission: EM | Admit: 2017-04-17 | Discharge: 2017-04-17 | Disposition: A | Discharge status: Home / Self Care | Payer: Medicaid Other | Attending: Emergency Medicine | Admitting: Emergency Medicine

## 2017-04-17 ENCOUNTER — Encounter (HOSPITAL_COMMUNITY): Payer: Self-pay

## 2017-04-17 ENCOUNTER — Emergency Department (HOSPITAL_COMMUNITY): Payer: Medicaid Other

## 2017-04-17 DIAGNOSIS — R1012 Left upper quadrant pain: Secondary | ICD-10-CM

## 2017-04-17 MED ORDER — IOPAMIDOL (ISOVUE-300) INJECTION 61%
INTRAVENOUS | Status: AC
  Administered 2017-04-17: 100 mL
  Filled 2017-04-17: qty 100

## 2017-04-17 MED ORDER — DICYCLOMINE HCL 20 MG PO TABS: 20.0000 mg | ORAL_TABLET | Freq: Two times a day (BID) | ORAL | 0 refills | Status: DC

## 2017-04-17 MED ORDER — SODIUM CHLORIDE 0.9 % IV BOLUS (SEPSIS)
1000.0000 mL | Freq: Once | INTRAVENOUS | Status: AC
  Administered 2017-04-17: 1000 mL via INTRAVENOUS

## 2017-04-17 MED ORDER — ONDANSETRON 4 MG PO TBDP
8.0000 mg | ORAL_TABLET | Freq: Once | ORAL | Status: AC
  Administered 2017-04-17: 8 mg via ORAL
  Filled 2017-04-17: qty 2

## 2017-04-17 NOTE — ED Provider Notes (Signed)
[facility] Provider Note   CSN: [id number] Arrival date & time: 04/16/17  2222     History   Chief Complaint Chief Complaint  Patient presents with  . Abdominal Pain    HPI Tiffany [name] is a 26 y.o. female.  HPI Tiffany [name] is a 26 y.o. female presents to ED with complaint of abdominal pain, nausea, vomiting, dizziness, anorexia. Pt states pain started yesterday and gradually worsened through out the day. Pain is in the upper abdomen, radiates to the back. Reports associated nausea, no vomiting. Reports several episodes of diarrhea. No blood in her stool or emesis. No fever. No urinary symptoms. No vaginal discharge or bleeding. Denies history of similar symptoms in the past. She does report palpating a mass on and off in her upper abdomen, states does not feel it at this time. States has no appetite, feels dizzy and no energy. No treatment prior to coming in.  Past Medical History:  Diagnosis Date  . Anemia    hx of  . Anxiety    not on any meds  . Asthma    Albuterol inhaler prn   . History of migraine    last one a week ago  . Insomnia    doesn't take any meds  . Shortness of breath dyspnea    with exertion or laying straight back    Patient Active Problem List   Diagnosis Date Noted  . Mild preeclampsia delivered 03/09/2013  . Vaginal delivery 03/08/2013  . Active labor 03/07/2013  . Anemia 03/07/2013  . 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 Cigarette smoker 02/10/2013  . Pregnant state, incidental 02/10/2013    Past Surgical History:  Procedure Laterality Date  . LIPOMA EXCISION Left 03/14/2015   Procedure: EXCISION OF LEFT ANTECUBITAL PHLEBOLITH;  Surgeon: Manus Rudd, MD;  Location: [facility] OR;  Service: General;  Laterality: Left;  . WISDOM TOOTH EXTRACTION  2009    OB History    Gravida Para Term Preterm AB Living   1 1 1  0 0 1   SAB TAB Ectopic Multiple Live Births   0 0 0 0 1       Home Medications      Prior to Admission medications   Medication Sig Start Date End Date Taking? Authorizing Provider  albuterol (PROVENTIL HFA;VENTOLIN HFA) 108 (90 BASE) MCG/ACT inhaler Inhale 2 puffs into the lungs every 4 (four) hours as needed for wheezing or shortness of breath. Patient not taking: Reported on 01/26/2017 06/05/15   Horton, Mayer Masker, MD  amoxicillin-clavulanate (AUGMENTIN) 875-125 MG tablet Take 1 tablet by mouth every 12 (twelve) hours. Patient not taking: Reported on 01/26/2017 02/23/16   Danelle Berry, PA-C  azithromycin (ZITHROMAX) 250 MG tablet Take 1 tablet (250 mg total) by mouth daily. Take first 2 tablets together, then 1 every day until finished. Patient not taking: Reported on 01/26/2017 06/11/15   Eber Hong, MD  benzonatate (TESSALON) 100 MG capsule Take 1 capsule (100 mg total) by mouth every 8 (eight) hours. Patient not taking: Reported on 01/26/2017 06/11/15   Eber Hong, MD  HYDROcodone-acetaminophen (NORCO/VICODIN) 5-325 MG per tablet Take 1 tablet by mouth every 4 (four) hours as needed. Patient not taking: Reported on 01/26/2017 03/14/15   Manus Rudd, MD  loperamide (IMODIUM) 2 MG capsule Take 1 capsule (2 mg total) by mouth as needed for diarrhea or loose stools. Patient not taking: Reported on 01/26/2017 06/05/15   Horton, Mayer Masker, MD  naproxen (  NAPROSYN) 375 MG tablet Take 1 tablet (375 mg total) by mouth 2 (two) times daily. Patient not taking: Reported on 01/26/2017 11/10/16   Felicie Morn, NP  ondansetron (ZOFRAN ODT) 4 MG disintegrating tablet Take 1 tablet (4 mg total) by mouth every 8 (eight) hours as needed for nausea or vomiting. Patient not taking: Reported on 01/26/2017 06/05/15   Horton, Mayer Masker, MD  predniSONE (DELTASONE) 20 MG tablet Take 2 tablets (40 mg total) by mouth daily with breakfast. Patient not taking: Reported on 01/26/2017 06/05/15   Horton, Mayer Masker, MD  traMADol (ULTRAM) 50 MG tablet Take 1 tablet (50 mg total) by mouth every 6 (six) hours as  needed. Patient not taking: Reported on 01/26/2017 02/23/16   Danelle Berry, PA-C    Family History Family History  Problem Relation Age of Onset  . Asthma Other   . Asthma Other   . Asthma Sister   . Asthma Brother   . Other Neg Hx   . Alcohol abuse Neg Hx   . Arthritis Neg Hx   . Birth defects Neg Hx   . Cancer Neg Hx   . COPD Neg Hx   . Depression Neg Hx   . Diabetes Neg Hx   . Drug abuse Neg Hx   . Early death Neg Hx   . Hearing loss Neg Hx   . Heart disease Neg Hx   . Hyperlipidemia Neg Hx   . Hypertension Neg Hx   . Kidney disease Neg Hx   . Learning disabilities Neg Hx   . Mental illness Neg Hx   . Mental retardation Neg Hx   . Miscarriages / Stillbirths Neg Hx   . Stroke Neg Hx   . Vision loss Neg Hx     Social History Social History  Substance Use Topics  . Smoking status: Current Every Day Smoker    Packs/day: 0.10    Years: 4.00    Types: Cigars  . Smokeless tobacco: Never Used     Comment: 2 cigs/day  . Alcohol use No     Allergies   Patient has no known allergies.   Review of Systems Review of Systems  Constitutional: Positive for fatigue. Negative for chills and fever.  Respiratory: Negative for cough, chest tightness and shortness of breath.   Cardiovascular: Negative for chest pain, palpitations and leg swelling.  Gastrointestinal: Positive for abdominal pain, diarrhea and nausea. Negative for vomiting.  Genitourinary: Negative for dysuria, flank pain, pelvic pain, vaginal bleeding, vaginal discharge and vaginal pain.  Musculoskeletal: Negative for arthralgias, myalgias, neck pain and neck stiffness.  Skin: Negative for rash.  Neurological: Positive for dizziness and weakness. Negative for headaches.  All other systems reviewed and are negative.    Physical Exam Updated Vital Signs BP 123/82 (BP Location: Left Arm)   Pulse 97   Temp 99 F (37.2 C) (Oral)   Resp 16   SpO2 98%   Physical Exam  Constitutional: She is oriented to  person, place, and time. She appears well-developed and well-nourished. No distress.  HENT:  Head: Normocephalic.  Eyes: Conjunctivae are normal.  Neck: Neck supple.  Cardiovascular: Normal rate, regular rhythm and normal heart sounds.   Pulmonary/Chest: Effort normal and breath sounds normal. No respiratory distress. She has no wheezes. She has no rales.  Abdominal: Soft. Bowel sounds are normal. She exhibits no distension. There is tenderness. There is no rebound.  Epigastric, left upper, left lower abdominal tenderness. No guarding  Musculoskeletal: She exhibits  no edema.  Neurological: She is alert and oriented to person, place, and time.  Skin: Skin is warm and dry.  Psychiatric: She has a normal mood and affect. Her behavior is normal.  Nursing note and vitals reviewed.    ED Treatments / Results  Labs (all labs ordered are listed, but only abnormal results are displayed) Labs Reviewed  COMPREHENSIVE METABOLIC PANEL - Abnormal; Notable for the following:       Result Value   Sodium 134 (*)    CO2 21 (*)    Total Bilirubin 1.3 (*)    All other components within normal limits  CBC - Abnormal; Notable for the following:    WBC 11.6 (*)    All other components within normal limits  URINALYSIS, ROUTINE W REFLEX MICROSCOPIC - Abnormal; Notable for the following:    APPearance HAZY (*)    Hgb urine dipstick SMALL (*)    Bacteria, UA RARE (*)    Squamous Epithelial / LPF 6-30 (*)    All other components within normal limits  LIPASE, BLOOD  I-STAT BETA HCG BLOOD, ED ([facility], [facility], [facility] ONLY)    EKG  EKG Interpretation None       Radiology Ct Abdomen Pelvis W Contrast  Result Date: 04/17/2017 CLINICAL DATA:  Left upper quadrant abdominal pain EXAM: CT ABDOMEN AND PELVIS WITH CONTRAST TECHNIQUE: Multidetector CT imaging of the abdomen and pelvis was performed using the standard protocol following bolus administration of intravenous contrast. CONTRAST:  100mL ISOVUE-300 IOPAMIDOL  (ISOVUE-300) INJECTION 61% COMPARISON:  None. FINDINGS: Lower chest: No pulmonary nodules. No visible pleural or pericardial effusion. Hepatobiliary: Normal hepatic size and contours without focal liver lesion. No perihepatic ascites. No intra- or extrahepatic biliary dilatation. Normal gallbladder. Pancreas: Normal pancreatic contours and enhancement. No peripancreatic fluid collection or pancreatic ductal dilatation. Spleen: Normal. Adrenals/Urinary Tract: Normal adrenal glands. No hydronephrosis or solid renal mass. Stomach/Bowel: There is no hiatal hernia. The stomach and duodenum are normal. There is no dilated small bowel or enteric inflammation. There is no colonic abnormality. The appendix is normal. Vascular/Lymphatic: Normal course and caliber of the major abdominal vessels. No abdominal or pelvic adenopathy. Reproductive: Normal uterus and ovaries. Musculoskeletal: No lytic or blastic osseous lesion. Normal visualized extrathoracic and extraperitoneal soft tissues. Other: No contributory non-categorized findings. IMPRESSION: No acute abnormality of the abdomen or pelvis.  Normal examination. Electronically Signed   By: Deatra RobinsonKevin  Herman M.D.   On: 04/17/2017 05:32    Procedures Procedures (including critical care time)  Medications Ordered in ED Medications  sodium chloride 0.9 % bolus 1,000 mL (0 mLs Intravenous Stopped 04/17/17 0625)  ondansetron (ZOFRAN-ODT) disintegrating tablet 8 mg (8 mg Oral Given 04/17/17 0319)  iopamidol (ISOVUE-300) 61 % injection (100 mLs  Contrast Given 04/17/17 0444)     Initial Impression / Assessment and Plan / ED Course  I have reviewed the triage vital signs and the nursing notes.  Pertinent labs & imaging results that were available during my care of the patient were reviewed by me and considered in my medical decision making (see chart for details).     Patient in the emergency department with abdominal pain, anorexia, nausea, diarrhea. Patient has pretty  significant tenderness in the left upper lower abdomen. She's got slightly elevated with blood cell count 11.6. Will hydrate, administer Zofran for nausea and vomiting, will get CT abdomen and pelvis to rule out colitis.  6:11 AM CT negative. Pain mainly in the left upper abdomen. Question gastritis vs  viral infection vs intestinal spasms. Will treat with bentyl. Increase fluids. Home with pcp follow up. VS normal. Abdomen is soft. I do not think any further testing indicated. Return precautions discussed.    Vitals:   04/17/17 0530 04/17/17 0545 04/17/17 0600 04/17/17 0615  BP: 107/67 102/62 (!) 103/58 114/84  Pulse: 78 81 82 81  Resp:      Temp:      TempSrc:      SpO2: 100% 100% 100% 100%     Final Clinical Impressions(s) / ED Diagnoses   Final diagnoses:  Left upper quadrant pain    New Prescriptions Discharge Medication List as of 04/17/2017  6:56 AM    START taking these medications   Details  !! dicyclomine (BENTYL) 20 MG tablet Take 1 tablet (20 mg total) by mouth 2 (two) times daily., Starting Sat 04/17/2017, Print    !! dicyclomine (BENTYL) 20 MG tablet Take 1 tablet (20 mg total) by mouth 2 (two) times daily., Starting Sat 04/17/2017, Print     !! - Potential duplicate medications found. Please discuss with provider.       Jaynie Crumble, PA-C 04/17/17 1610    Pricilla Loveless, MD 04/17/17 [phone]

## 2017-04-17 NOTE — ED Notes (Signed)
Patient transported to CT 

## 2017-04-17 NOTE — Discharge Instructions (Signed)
Your CT scan today was normal. Start taking bentyl as prescribed as needed. Drink plenty of fluids. Follow up with family doctor if not improving.

## 2017-04-17 NOTE — ED Notes (Signed)
Pt understood dc material. NAD Noted. SCript given at dc 

## 2018-03-06 ENCOUNTER — Emergency Department (HOSPITAL_COMMUNITY): Admission: EM | Admit: 2018-03-06 | Discharge: 2018-03-06 | Discharge status: Against Medical Advice | Payer: Self-pay

## 2018-03-06 ENCOUNTER — Other Ambulatory Visit: Payer: Self-pay

## 2018-03-06 NOTE — ED Notes (Signed)
Called PT three times from lobby area. No answer 

## 2018-04-01 ENCOUNTER — Ambulatory Visit: Payer: Medicaid Other | Admitting: Podiatry

## 2018-04-01 ENCOUNTER — Ambulatory Visit (INDEPENDENT_AMBULATORY_CARE_PROVIDER_SITE_OTHER): Payer: Medicaid Other

## 2018-04-01 ENCOUNTER — Encounter: Payer: Self-pay | Admitting: Podiatry

## 2018-04-01 ENCOUNTER — Other Ambulatory Visit: Payer: Self-pay | Admitting: Podiatry

## 2018-04-01 VITALS — BP 112/66 | HR 83 | Resp 16

## 2018-04-01 DIAGNOSIS — M2011 Hallux valgus (acquired), right foot: Secondary | ICD-10-CM

## 2018-04-01 DIAGNOSIS — M21612 Bunion of left foot: Secondary | ICD-10-CM | POA: Diagnosis not present

## 2018-04-01 DIAGNOSIS — M21611 Bunion of right foot: Secondary | ICD-10-CM | POA: Diagnosis not present

## 2018-04-01 DIAGNOSIS — M21619 Bunion of unspecified foot: Secondary | ICD-10-CM

## 2018-04-01 DIAGNOSIS — M79671 Pain in right foot: Secondary | ICD-10-CM

## 2018-04-01 DIAGNOSIS — M79672 Pain in left foot: Principal | ICD-10-CM

## 2018-04-01 DIAGNOSIS — M2012 Hallux valgus (acquired), left foot: Principal | ICD-10-CM

## 2018-04-01 DIAGNOSIS — M2040 Other hammer toe(s) (acquired), unspecified foot: Secondary | ICD-10-CM

## 2018-04-01 NOTE — Progress Notes (Signed)
   Subjective:    Patient ID: Tiffany [name], female    DOB: [birth year], 27 y.o.   MRN: [id number]  HPI    Review of Systems  All other systems reviewed and are negative.      Objective:   Physical Exam        Assessment & Plan:

## 2018-04-01 NOTE — Patient Instructions (Signed)

## 2018-04-04 NOTE — Progress Notes (Signed)
Subjective:   Patient ID: Tiffany [name], female   DOB: 27 y.o.   MRN: [id number]   HPI Patient presents with painful bunion deformity right over left and digital deformity digit 5 right over left that becomes painful.  Patient had discoloration of her heel that she was also concerned about states that she is tried wider shoes she is tried padding and other modalities without relief.  Patient does not smoke currently and likes to be active   Review of Systems  All other systems reviewed and are negative.       Objective:  Physical Exam  Constitutional: She appears well-developed and well-nourished.  Cardiovascular: Intact distal pulses.  Pulmonary/Chest: Effort normal.  Musculoskeletal: Normal range of motion.  Neurological: She is alert.  Skin: Skin is warm.  Nursing note and vitals reviewed.   Neurovascular status intact muscle strength is adequate range of motion within normal limits with patient found to have hyper pigmented type skin formation structural pathology around the first metatarsal head right and keratotic lesion fifth digit bilateral that are tender with palpation.  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  I do think these bunions could be corrected and I do think it can be done with distal osteotomy along with arthroplasty of the fifth toe but I explained procedures the patient but I did explain that will probably cause some kind of hyperpigmentation within the incision due to her natural skin structure and will most likely leave her with the scar.  Patient wants to get them fixed but cannot do currently and will schedule when it works for her time  wants  X-rays indicate elevation of the intermetatarsal angle with rotation of the fifth digit bilateral

## 2018-04-05 ENCOUNTER — Telehealth: Payer: Self-pay | Admitting: *Deleted

## 2018-04-05 NOTE — Telephone Encounter (Signed)
"  I'm calling to schedule my surgery."  Have you signed consent forms and have you received a surgical kit?  "No, he told me to call you to schedule surgery and that he would see me back before I had the surgery."  I will schedule your surgery tentatively until you come in for a consultation with Dr. Paulla Dolly.  Do you have a date in mind that you like to schedule your surgery?  "I'd like to do it after May 13."  Dr. Paulla Dolly can do it on May 29.  "Okay, that date will be fine."  I'm going to send you to a scheduler so you can schedule an appointment with Dr. Paulla Dolly.  "Okay, thank you so much."  I transferred the call to Adonis Brook so she could make an appointment.

## 2018-04-18 ENCOUNTER — Ambulatory Visit: Payer: Medicaid Other | Admitting: Podiatry

## 2018-04-18 ENCOUNTER — Encounter: Payer: Self-pay | Admitting: Podiatry

## 2018-04-18 DIAGNOSIS — M216X1 Other acquired deformities of right foot: Secondary | ICD-10-CM | POA: Diagnosis not present

## 2018-04-18 DIAGNOSIS — M21619 Bunion of unspecified foot: Secondary | ICD-10-CM

## 2018-04-18 DIAGNOSIS — M2040 Other hammer toe(s) (acquired), unspecified foot: Secondary | ICD-10-CM

## 2018-04-18 NOTE — Patient Instructions (Signed)
Pre-Operative Instructions  Congratulations, you have decided to take an important step towards improving your quality of life.  You can be assured that the doctors and staff at [facility] will be with you every step of the way.  Here are some important things you should know:  1. Plan to be at the surgery center/hospital at least 1 (one) hour prior to your scheduled time, unless otherwise directed by the surgical center/hospital staff.  You must have a responsible adult accompany you, remain during the surgery and drive you home.  Make sure you have directions to the surgical center/hospital to ensure you arrive on time. 2. If you are having surgery at [facility] or [facility], you will need a copy of your medical history and physical form from your family physician within one month prior to the date of surgery. We will give you a form for your primary physician to complete.  3. We make every effort to accommodate the date you request for surgery.  However, there are times where surgery dates or times have to be moved.  We will contact you as soon as possible if a change in schedule is required.   4. No aspirin/ibuprofen for one week before surgery.  If you are on aspirin, any non-steroidal anti-inflammatory medications (Mobic, Aleve, Ibuprofen) should not be taken seven (7) days prior to your surgery.  You make take Tylenol for pain prior to surgery.  5. Medications - If you are taking daily heart and blood pressure medications, seizure, reflux, allergy, asthma, anxiety, pain or diabetes medications, make sure you notify the surgery center/hospital before the day of surgery so they can tell you which medications you should take or avoid the day of surgery. 6. No food or drink after midnight the night before surgery unless directed otherwise by surgical center/hospital staff. 7. No alcoholic beverages 24-hours prior to surgery.  No smoking 24-hours prior or 24-hours after  surgery. 8. Wear loose pants or shorts. They should be loose enough to fit over bandages, boots, and casts. 9. Don't wear slip-on shoes. Sneakers are preferred. 10. Bring your boot with you to the surgery center/hospital.  Also bring crutches or a walker if your physician has prescribed it for you.  If you do not have this equipment, it will be provided for you after surgery. 11. If you have not been contacted by the surgery center/hospital by the day before your surgery, call to confirm the date and time of your surgery. 12. Leave-time from work may vary depending on the type of surgery you have.  Appropriate arrangements should be made prior to surgery with your employer. 13. Prescriptions will be provided immediately following surgery by your doctor.  Fill these as soon as possible after surgery and take the medication as directed. Pain medications will not be refilled on weekends and must be approved by the doctor. 14. Remove nail polish on the operative foot and avoid getting pedicures prior to surgery. 15. Wash the night before surgery.  The night before surgery wash the foot and leg well with water and the antibacterial soap provided. Be sure to pay special attention to beneath the toenails and in between the toes.  Wash for at least three (3) minutes. Rinse thoroughly with water and dry well with a towel.  Perform this wash unless told not to do so by your physician.  Enclosed: 1 Ice pack (please put in freezer the night before surgery)   1 Hibiclens skin cleaner     Pre-op instructions  If you have any questions regarding the instructions, please do not hesitate to call our office.  [place]: [address], [place], [place] [address] -- [phone]  : [address] , [place] [address] -- [phone]  [place]: [address]  [place], [place] [address] -- [phone]  [place]: [address], [address], [place], [place] [address] -- [phone]  Website:  [email or web address] 

## 2018-04-20 NOTE — Progress Notes (Signed)
Subjective:   Patient ID: Tiffany [name], female   DOB: 27 y.o.   MRN: [id number]   HPI Patient presents with chronic bunion deformity and digital deformity right over left foot stating that it is been sore and making it hard for her to wear shoe gear.  Patient states that she has tried wider shoes and other modalities without relief   ROS      Objective:  Physical Exam  Neurovascular status intact with patient found to have hyperostosis medial aspect first metatarsal head right and severe keratotic lesion fifth digit right over left is painful when palpated.  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  I spent a great deal time going over alternative treatments complications associated with surgery and patient is willing to accept risk understanding complications.  I also discussed with her that she does tend to have hyper pigmented and it may leave her with some darker type skin after the surgery and it may possibly create hypertrophic type skin or keloid formation.  Patient understands this is willing to take that risk and after extensive consultation signed consent form and also understands the total recovery can take 6 months to 1 year.  Patient is scheduled for outpatient surgery in the next several weeks and is encouraged to call with any questions she may have

## 2018-04-26 ENCOUNTER — Encounter (HOSPITAL_COMMUNITY): Payer: Self-pay | Admitting: Emergency Medicine

## 2018-04-26 ENCOUNTER — Encounter: Payer: Self-pay | Admitting: Podiatry

## 2018-04-26 ENCOUNTER — Emergency Department (HOSPITAL_COMMUNITY)
Admission: EM | Admit: 2018-04-26 | Discharge: 2018-04-26 | Disposition: A | Discharge status: Home / Self Care | Payer: Medicaid Other | Attending: Emergency Medicine | Admitting: Emergency Medicine

## 2018-04-26 DIAGNOSIS — J45909 Unspecified asthma, uncomplicated: Secondary | ICD-10-CM | POA: Insufficient documentation

## 2018-04-26 DIAGNOSIS — Z79899 Other long term (current) drug therapy: Secondary | ICD-10-CM | POA: Diagnosis not present

## 2018-04-26 DIAGNOSIS — G8918 Other acute postprocedural pain: Secondary | ICD-10-CM | POA: Diagnosis not present

## 2018-04-26 DIAGNOSIS — F17290 Nicotine dependence, other tobacco product, uncomplicated: Secondary | ICD-10-CM | POA: Diagnosis not present

## 2018-04-26 DIAGNOSIS — F419 Anxiety disorder, unspecified: Secondary | ICD-10-CM | POA: Insufficient documentation

## 2018-04-26 DIAGNOSIS — M2041 Other hammer toe(s) (acquired), right foot: Secondary | ICD-10-CM

## 2018-04-26 DIAGNOSIS — M79671 Pain in right foot: Secondary | ICD-10-CM | POA: Diagnosis present

## 2018-04-26 DIAGNOSIS — M2011 Hallux valgus (acquired), right foot: Secondary | ICD-10-CM | POA: Diagnosis not present

## 2018-04-26 MED ORDER — OXYCODONE-ACETAMINOPHEN 5-325 MG PO TABS: 1.0000 | ORAL_TABLET | Freq: Four times a day (QID) | ORAL | 0 refills | Status: DC | PRN

## 2018-04-26 MED ORDER — OXYCODONE-ACETAMINOPHEN 5-325 MG PO TABS
1.0000 | ORAL_TABLET | Freq: Once | ORAL | Status: AC
  Administered 2018-04-26: 1 via ORAL
  Filled 2018-04-26: qty 1

## 2018-04-26 MED ORDER — PROMETHAZINE HCL 25 MG PO TABS: 25.0000 mg | ORAL_TABLET | Freq: Four times a day (QID) | ORAL | 0 refills | Status: DC | PRN

## 2018-04-26 NOTE — Discharge Instructions (Addendum)
Please read attached information. If you experience any new or worsening signs or symptoms please return to the emergency room for evaluation. Please follow-up with your primary care provider or specialist as discussed. Please use medication prescribed only as directed and discontinue taking if you have any concerning signs or symptoms.   °

## 2018-04-26 NOTE — ED Notes (Signed)
PA-C at bedside 

## 2018-04-26 NOTE — ED Notes (Signed)
Patient verbalized understanding of discharge instructions and prescriptions and denies any further needs or questions at this time. VS stable. Patient ambulatory with steady gait - assisted to ED entrance in wheelchair.

## 2018-04-26 NOTE — ED Provider Notes (Signed)
[facility] EMERGENCY DEPARTMENT Provider Note   CSN: [id number] Arrival date & time: 04/26/18  2121   History   Chief Complaint Chief Complaint  Patient presents with  . Medication Refill    HPI Tiffany [name] is a 27 y.o. female.  HPI   27 year old female presents today for pain medication.  Patient reports that she had a bunion correction today on her right lower foot.  She notes that she was discharged home with Demerol prescription and promethazine.  She notes she went to 6 different pharmacies they did not have this medication.  She notes pain to the foot.  She denies any other concerns here today.       Past Medical History:  Diagnosis Date  . Anemia    hx of  . Anxiety    not on any meds  . Asthma    Albuterol inhaler prn   . History of migraine    last one a week ago  . Insomnia    doesn't take any meds  . Shortness of breath dyspnea    with exertion or laying straight back    Patient Active Problem List   Diagnosis Date Noted  . Mild preeclampsia delivered 03/09/2013  . Vaginal delivery 03/08/2013  . Active labor 03/07/2013  . Anemia 03/07/2013  . 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 Cigarette smoker 02/10/2013  . Pregnant state, incidental 02/10/2013    Past Surgical History:  Procedure Laterality Date  . LIPOMA EXCISION Left 03/14/2015   Procedure: EXCISION OF LEFT ANTECUBITAL PHLEBOLITH;  Surgeon: Manus Rudd, MD;  Location: [facility] OR;  Service: General;  Laterality: Left;  . WISDOM TOOTH EXTRACTION  2009     OB History    Gravida  1   Para  1   Term  1   Preterm  0   AB  0   Living  1     SAB  0   TAB  0   Ectopic  0   Multiple  0   Live Births  1            Home Medications    Prior to Admission medications   Medication Sig Start Date End Date Taking? Authorizing Provider  albuterol (PROVENTIL HFA;VENTOLIN HFA) 108 (90 BASE) MCG/ACT inhaler Inhale 2 puffs into the  lungs every 4 (four) hours as needed for wheezing or shortness of breath. 06/05/15   Horton, Mayer Masker, MD  amoxicillin-clavulanate (AUGMENTIN) 875-125 MG tablet Take 1 tablet by mouth every 12 (twelve) hours. 02/23/16   Danelle Berry, PA-C  azithromycin (ZITHROMAX) 250 MG tablet Take 1 tablet (250 mg total) by mouth daily. Take first 2 tablets together, then 1 every day until finished. 06/11/15   Eber Hong, MD  benzonatate (TESSALON) 100 MG capsule Take 1 capsule (100 mg total) by mouth every 8 (eight) hours. 06/11/15   Eber Hong, MD  dicyclomine (BENTYL) 20 MG tablet Take 1 tablet (20 mg total) by mouth 2 (two) times daily. 04/17/17   Kirichenko, Tatyana, PA-C  dicyclomine (BENTYL) 20 MG tablet Take 1 tablet (20 mg total) by mouth 2 (two) times daily. 04/17/17   Kirichenko, Lemont Fillers, PA-C  HYDROcodone-acetaminophen (NORCO/VICODIN) 5-325 MG per tablet Take 1 tablet by mouth every 4 (four) hours as needed. 03/14/15   Manus Rudd, MD  loperamide (IMODIUM) 2 MG capsule Take 1 capsule (2 mg total) by mouth as needed for diarrhea or loose stools. 06/05/15   Horton,  Mayer Masker, MD  medroxyPROGESTERone (DEPO-PROVERA) 150 MG/ML injection Inject 150 mg into the muscle every 3 (three) months.    [provider]  naproxen (NAPROSYN) 375 MG tablet Take 1 tablet (375 mg total) by mouth 2 (two) times daily. 11/10/16   Felicie Morn, NP  ondansetron (ZOFRAN ODT) 4 MG disintegrating tablet Take 1 tablet (4 mg total) by mouth every 8 (eight) hours as needed for nausea or vomiting. 06/05/15   Horton, Mayer Masker, MD  oxyCODONE-acetaminophen (PERCOCET) 5-325 MG tablet Take 1 tablet by mouth every 6 (six) hours as needed for severe pain. 04/28/18   Lenn Sink, DPM  oxyCODONE-acetaminophen (PERCOCET/ROXICET) 5-325 MG tablet Take 1 tablet by mouth every 6 (six) hours as needed for severe pain. 04/26/18   Irelynd Zumstein, Tinnie Gens, PA-C  predniSONE (DELTASONE) 20 MG tablet Take 2 tablets (40 mg total) by mouth daily with  breakfast. 06/05/15   Horton, Mayer Masker, MD  promethazine (PHENERGAN) 25 MG tablet Take 1 tablet (25 mg total) by mouth every 6 (six) hours as needed for nausea or vomiting. 04/26/18   Delana Manganello, Tinnie Gens, PA-C  traMADol (ULTRAM) 50 MG tablet Take 1 tablet (50 mg total) by mouth every 6 (six) hours as needed. 02/23/16   Danelle Berry, PA-C    Family History Family History  Problem Relation Age of Onset  . Asthma Other   . Asthma Other   . Asthma Sister   . Asthma Brother   . Other Neg Hx   . Alcohol abuse Neg Hx   . Arthritis Neg Hx   . Birth defects Neg Hx   . Cancer Neg Hx   . COPD Neg Hx   . Depression Neg Hx   . Diabetes Neg Hx   . Drug abuse Neg Hx   . Early death Neg Hx   . Hearing loss Neg Hx   . Heart disease Neg Hx   . Hyperlipidemia Neg Hx   . Hypertension Neg Hx   . Kidney disease Neg Hx   . Learning disabilities Neg Hx   . Mental illness Neg Hx   . Mental retardation Neg Hx   . Miscarriages / Stillbirths Neg Hx   . Stroke Neg Hx   . Vision loss Neg Hx     Social History Social History   Tobacco Use  . Smoking status: Current Every Day Smoker    Packs/day: 0.10    Years: 4.00    Pack years: 0.40    Types: Cigars  . Smokeless tobacco: Never Used  . Tobacco comment: 2 cigs/day  Substance Use Topics  . Alcohol use: No  . Drug use: No    Frequency: 2.0 times per week     Allergies   Patient has no known allergies.   Review of Systems Review of Systems  All other systems reviewed and are negative.    Physical Exam Updated Vital Signs BP 124/68 (BP Location: Left Arm)   Pulse 81   Temp 98.4 F (36.9 C) (Oral)   Resp 14   SpO2 100%   Physical Exam  Constitutional: She is oriented to person, place, and time. She appears well-developed and well-nourished.  HENT:  Head: Normocephalic and atraumatic.  Eyes: Pupils are equal, round, and reactive to light. Conjunctivae are normal. Right eye exhibits no discharge. Left eye exhibits no discharge. No  scleral icterus.  Neck: Normal range of motion. No JVD present. No tracheal deviation present.  Pulmonary/Chest: Effort normal. No stridor.  Musculoskeletal:  Walking  boot in place to right foot  Neurological: She is alert and oriented to person, place, and time. Coordination normal.  Psychiatric: She has a normal mood and affect. Her behavior is normal. Judgment and thought content normal.  Nursing note and vitals reviewed.    ED Treatments / Results  Labs (all labs ordered are listed, but only abnormal results are displayed) Labs Reviewed - No data to display  EKG None  Radiology No results found.  Procedures Procedures (including critical care time)  Medications Ordered in ED Medications  oxyCODONE-acetaminophen (PERCOCET/ROXICET) 5-325 MG per tablet 1 tablet (1 tablet Oral Given 04/26/18 2211)     Initial Impression / Assessment and Plan / ED Course  I have reviewed the triage vital signs and the nursing notes.  Pertinent labs & imaging results that were available during my care of the patient were reviewed by me and considered in my medical decision making (see chart for details).     Patient presents for pain medication.  Patient had procedure today, she has a prescription in hand for Demerol.  No pharmacies with this medication, I do find it reasonable to give her a short prescription of Percocet for pain until she can follow-up with podiatry.  She is given return precautions, she verbalized understanding and agreement to today's plan had no further questions or concerns. Final Clinical Impressions(s) / ED Diagnoses   Final diagnoses:  Post-op pain    ED Discharge Orders        Ordered    oxyCODONE-acetaminophen (PERCOCET/ROXICET) 5-325 MG tablet  Every 6 hours PRN     04/26/18 2203    promethazine (PHENERGAN) 25 MG tablet  Every 6 hours PRN     04/26/18 2203       Eyvonne Mechanic, PA-C 04/27/18 1742    Doug Sou, MD 04/28/18 0001

## 2018-04-26 NOTE — ED Triage Notes (Signed)
Patient to ED for pain medication s/p surgery on R lower leg today. Patient was given 2 prescriptions, one for phenergan and one for demerol for pain. Patient states she went to multiple pharmacies today and was told the pharmacies do not carry it anymore. Patient here for prescription for pain medication.

## 2018-04-27 ENCOUNTER — Other Ambulatory Visit: Payer: Self-pay | Admitting: Podiatry

## 2018-04-27 MED ORDER — OXYCODONE-ACETAMINOPHEN 5-325 MG PO TABS: 1.0000 | ORAL_TABLET | Freq: Four times a day (QID) | ORAL | 0 refills | Status: DC | PRN

## 2018-04-27 NOTE — Telephone Encounter (Signed)
Pt states the hospital says demerol isn't made anymore, and she was prescribed another medication, but the hospital was able to give her the other medication. I reviewed pt's Chart Review and she was seen in th ED yesterday for post op pain and given percocet 5/325 #10 one tablet every 6 hours. I told pt I would inform Dr. Charlsie Merles and she should be able to pick up a prescription for Percocet tomorrow.

## 2018-04-27 NOTE — Telephone Encounter (Signed)
I informed pt Dr. Charlsie Merles had ordered refill the percocet 5/325mg  #20 one tablet every 6 hours prn foot pain, and she could pick up in the [place] office.

## 2018-04-27 NOTE — Addendum Note (Signed)
Addended by: Alphia Kava D on: 04/27/2018 04:08 PM   Modules accepted: Orders

## 2018-04-27 NOTE — Telephone Encounter (Signed)
Current medication that was prescribe is no longer available and the patient called to see if she can get a different medication refilled to her pharmacy. If you can call her back at [phone]. Pharmacy to be sent to is [facility] on [address]

## 2018-04-28 ENCOUNTER — Telehealth: Payer: Self-pay | Admitting: *Deleted

## 2018-04-28 NOTE — Telephone Encounter (Signed)
Pt asked if she could take 2 of the pain medications. I told pt no, she could add ibuprofen OTC as the package instructs if tolerates. I told pt often if the ace wrap is loosened there will be a change in the discomfort level. I told pt to sit, remove the surgery boot, open-ended sock and ace wrap, then elevate the surgery foot for 15 minutes, after 15 minutes place foot level with hip and beginning at the toes rewrap the ace loosely moving up the leg, reapply the sock and boot. Pt asked if she could take off the boot. I told pt if she was resting and not sleeping she could remove the boot, but if she was going to sleep or walk she had to have the boot on at all times. Pt states understanding.

## 2018-05-02 ENCOUNTER — Telehealth: Payer: Self-pay | Admitting: Podiatry

## 2018-05-02 NOTE — Telephone Encounter (Signed)
I called and left a message for someone from [employer] to call me back directly at [phone] in regards to the medical records request sent this morning. They were wanting the information back by the end of the day but I called to see if they would like for me to wait until the pt comes in to be seen on Wednesday and once that note is dictated I can fax everything at once to them.

## 2018-05-04 ENCOUNTER — Ambulatory Visit (INDEPENDENT_AMBULATORY_CARE_PROVIDER_SITE_OTHER): Payer: Medicaid Other

## 2018-05-04 VITALS — BP 118/66 | HR 71 | Temp 99.4°F

## 2018-05-04 DIAGNOSIS — M2041 Other hammer toe(s) (acquired), right foot: Secondary | ICD-10-CM | POA: Diagnosis not present

## 2018-05-04 DIAGNOSIS — M2040 Other hammer toe(s) (acquired), unspecified foot: Secondary | ICD-10-CM

## 2018-05-04 DIAGNOSIS — M21611 Bunion of right foot: Secondary | ICD-10-CM

## 2018-05-04 DIAGNOSIS — M21619 Bunion of unspecified foot: Secondary | ICD-10-CM | POA: Diagnosis not present

## 2018-05-04 NOTE — Progress Notes (Signed)
[facility] hammertoe repair Rt 5th digit, DOS 04/26/18. She states that she is having some tingling and throbbing in her foot at times, currently taking prescription pain medication as needed for pain.  Surgical incision healing well, no erythema or drainage noted to either surgical sites. Wound edges aligned and coapted. Mild edema present, WNL for post operative state. Patient is overall doing well. Xrays taken and reviewed by Dr Logan BoresEvans, patient was also evaluated by doctor.   Instructed patient on post operative care and proper use of boot and surgical shoe. Discussed tapering off of prescription pain medication and utilizing motrin prn for pain. We also discussed and demonstrated passive  ROM exercises to help increase mobility. She was advised to remain in her boot at all times except when showering or performing passive ROM exercises. Darco shoe was dispensed with instructions on use. Advised of signs and symptoms of infection. She is to follow up in 2 weeks for post operative evaluation and suture removal, sooner if problems arise.

## 2018-05-05 ENCOUNTER — Telehealth: Payer: Self-pay | Admitting: Podiatry

## 2018-05-05 NOTE — Telephone Encounter (Signed)
Called and left a message letting them know I got the message and that I was waiting on pt's note from yesterday's visit to be dictated before I faxed her records. I told them in the message I thought I had already explained that earlier this week when they sent the records request. I stated her note from yesterday has been dictated so I would get everything together and go on and fax. Told them to call me directly at [phone] with any questions regarding this records request.

## 2018-05-05 NOTE — Telephone Encounter (Signed)
This is Tax inspectorTim calling from [employer] on behalf of [employer]. I'm calling to check on the status of the medical records request on J[place]. Once you have received this message, please call back right away at [phone] extension [phone]. Thank you and have a good day.

## 2018-05-16 ENCOUNTER — Telehealth: Payer: Self-pay | Admitting: Podiatry

## 2018-05-16 NOTE — Telephone Encounter (Signed)
Bump right beside incision- looks like a blood clot. Patient wants to know if she needs to be seen today or wait for appointment for Wednesday. It also itches pretty bad. If you can call patient back at [phone]

## 2018-05-16 NOTE — Telephone Encounter (Signed)
I told pt she could put a neosporin bandaid over the area until seen in office. I asked pt if she had anymore redness, or swelling than previously in the surgery foot and she stated no. I told pt if she did worsen give our office a call or go to the ED. Pt states understanding.

## 2018-05-18 ENCOUNTER — Ambulatory Visit (INDEPENDENT_AMBULATORY_CARE_PROVIDER_SITE_OTHER): Payer: Medicaid Other | Admitting: Podiatry

## 2018-05-18 ENCOUNTER — Ambulatory Visit (INDEPENDENT_AMBULATORY_CARE_PROVIDER_SITE_OTHER): Payer: Medicaid Other

## 2018-05-18 DIAGNOSIS — M2040 Other hammer toe(s) (acquired), unspecified foot: Secondary | ICD-10-CM

## 2018-05-18 DIAGNOSIS — M2041 Other hammer toe(s) (acquired), right foot: Secondary | ICD-10-CM | POA: Diagnosis not present

## 2018-05-18 DIAGNOSIS — M21619 Bunion of unspecified foot: Secondary | ICD-10-CM

## 2018-05-19 NOTE — Progress Notes (Signed)
Subjective:   Patient ID: Tiffany [name], female   DOB: 27 y.o.   MRN: [id number]   HPI Patient states doing real well with right foot with minimal discomfort noted   ROS      Objective:  Physical Exam  Neurovascular status intact with pain of the right foot doing well with good alignment noted wound edges well coapted no pathology noted     Assessment:  Doing well overall with foot surgery right     Plan:  Advised on continued immobilization compression elevation with gradual return to soft shoe gear and explained the importance of bending the big toe to keep the motion significant and good.  Patient will be seen back 4 weeks or earlier if needed  X-ray indicates satisfactory healing of bone with fixation in place and fifth toe in good alignment

## 2018-06-06 ENCOUNTER — Telehealth: Payer: Self-pay | Admitting: Podiatry

## 2018-06-06 NOTE — Telephone Encounter (Signed)
I spoke with Tiffany [name] and informed I did not see in her chart, that Dr. Charlsie Merlesegal had released her to return to work. Tiffany [name] states her disability states she is to return on the 06/08/2018. I told Tiffany [name] I would ask Dr. Charlsie Merlesegal and get back with her as quickly as possible.

## 2018-06-06 NOTE — Telephone Encounter (Signed)
Pt wanted to know if she could go back to work on 06/08/2018 or did she need to wait to her next appointment which is on 06/22/2018. Pt also wanted to know how often should she leave the compression sock on. Please give her a call

## 2018-06-08 ENCOUNTER — Telehealth: Payer: Self-pay | Admitting: Podiatry

## 2018-06-08 ENCOUNTER — Encounter: Payer: Self-pay | Admitting: *Deleted

## 2018-06-08 NOTE — Telephone Encounter (Signed)
Pt called in reference to a work note. She was suppose to be released to return to work today.

## 2018-06-08 NOTE — Telephone Encounter (Signed)
I informed Tiffany [name] Dr. Charlsie Merlesegal released her to work, must be in the surgery shoes and should be allowed to have seated duties for 30 minutes every hour until reevaluated 06/22/2018. Tiffany [name] states fax to her [phone].

## 2018-06-09 ENCOUNTER — Encounter: Payer: Self-pay | Admitting: *Deleted

## 2018-06-09 ENCOUNTER — Telehealth: Payer: Self-pay | Admitting: Podiatry

## 2018-06-09 NOTE — Telephone Encounter (Signed)
I spoke with Tiffany [name] - [employer] and clarified that Dr. Charlsie Merlesegal wanted pt in the surgical shoe for protection of the surgical foot and she needed 30 minute/hour seated breaks to decrease swelling and discomfort. Tiffany StanfordJenna states pt gets 2 30 minute breaks every 8-10 hour shift. Dr. Charlsie Merlesegal states pt may have 4 30 minute seated breaks per 8-10 hour shift and be in the surgical shoe. Tiffany StanfordJenna states she need a letter stating the changes. Faxed the letter AttnEileen [name]: [name] [phone].

## 2018-06-09 NOTE — Telephone Encounter (Signed)
This is BelgiumJenna calling from [employer]. Calling in regards to the work excuse I received. I'm calling to get clarification on the second part of the work restriction/work release. It says "Ms. 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 My phone number is [phone]. Thank you.

## 2018-06-10 NOTE — Telephone Encounter (Signed)
fine

## 2018-06-22 ENCOUNTER — Ambulatory Visit (INDEPENDENT_AMBULATORY_CARE_PROVIDER_SITE_OTHER): Payer: Medicaid Other

## 2018-06-22 ENCOUNTER — Encounter: Payer: Self-pay | Admitting: Podiatry

## 2018-06-22 ENCOUNTER — Ambulatory Visit (INDEPENDENT_AMBULATORY_CARE_PROVIDER_SITE_OTHER): Payer: Medicaid Other | Admitting: Podiatry

## 2018-06-22 DIAGNOSIS — M2011 Hallux valgus (acquired), right foot: Secondary | ICD-10-CM

## 2018-06-22 DIAGNOSIS — M21619 Bunion of unspecified foot: Secondary | ICD-10-CM

## 2018-06-22 DIAGNOSIS — L309 Dermatitis, unspecified: Secondary | ICD-10-CM

## 2018-06-22 NOTE — Progress Notes (Signed)
Subjective:   Patient ID: Tiffany ReiningNatisha R [name], female   DOB: 27 y.o.   MRN: [id number]   HPI Patient states overall doing real well with my right foot with mild discomfort if I do too much but overall doing well and working on my joint motion   ROS      Objective:  Physical Exam  Neurovascular status intact with patient's right foot healing well wound edges well coapted range of motion within adequate amount currently with good alignment noted     Assessment:  Doing well post foot surgery right     Plan:  Final x-ray reviewed and allow patient to return to normal activity and encouraged her of the importance of wearing shoe gear  X-ray indicates that the osteotomy is healing well good alignment joint congruous with no signs of pathology

## 2018-06-27 ENCOUNTER — Emergency Department (HOSPITAL_COMMUNITY): Payer: Medicaid Other

## 2018-06-27 ENCOUNTER — Encounter (HOSPITAL_COMMUNITY): Payer: Self-pay

## 2018-06-27 ENCOUNTER — Emergency Department (HOSPITAL_COMMUNITY)
Admission: EM | Admit: 2018-06-27 | Discharge: 2018-06-27 | Disposition: A | Discharge status: Home / Self Care | Payer: Medicaid Other | Attending: Emergency Medicine | Admitting: Emergency Medicine

## 2018-06-27 DIAGNOSIS — J45909 Unspecified asthma, uncomplicated: Secondary | ICD-10-CM | POA: Diagnosis not present

## 2018-06-27 DIAGNOSIS — R1032 Left lower quadrant pain: Secondary | ICD-10-CM | POA: Diagnosis present

## 2018-06-27 DIAGNOSIS — R3129 Other microscopic hematuria: Secondary | ICD-10-CM | POA: Diagnosis not present

## 2018-06-27 DIAGNOSIS — R109 Unspecified abdominal pain: Secondary | ICD-10-CM

## 2018-06-27 DIAGNOSIS — F17210 Nicotine dependence, cigarettes, uncomplicated: Secondary | ICD-10-CM | POA: Diagnosis not present

## 2018-06-27 LAB — URINALYSIS, ROUTINE W REFLEX MICROSCOPIC
BILIRUBIN URINE: NEGATIVE
GLUCOSE, UA: NEGATIVE mg/dL
KETONES UR: NEGATIVE mg/dL
LEUKOCYTES UA: NEGATIVE
NITRITE: NEGATIVE
PROTEIN: NEGATIVE mg/dL
Specific Gravity, Urine: 1.028 (ref 1.005–1.030)
pH: 5 (ref 5.0–8.0)

## 2018-06-27 LAB — POC URINE PREG, ED: PREG TEST UR: NEGATIVE

## 2018-06-27 MED ORDER — NAPROXEN 375 MG PO TABS: 375.0000 mg | ORAL_TABLET | Freq: Two times a day (BID) | ORAL | 0 refills | Status: DC

## 2018-06-27 MED ORDER — KETOROLAC TROMETHAMINE 60 MG/2ML IM SOLN
60.0000 mg | Freq: Once | INTRAMUSCULAR | Status: AC
  Administered 2018-06-27: 60 mg via INTRAMUSCULAR
  Filled 2018-06-27: qty 2

## 2018-06-27 MED ORDER — METHOCARBAMOL 500 MG PO TABS: 500.0000 mg | ORAL_TABLET | Freq: Two times a day (BID) | ORAL | 0 refills | Status: DC | PRN

## 2018-06-27 NOTE — ED Provider Notes (Signed)
[facility] EMERGENCY DEPARTMENT Provider Note   CSN: [id number] Arrival date & time: 06/27/18  0356    History   Chief Complaint Chief Complaint  Patient presents with  . Flank Pain    HPI Tiffany [name] is a 27 y.o. female.  27 year old female with no significant past medical history presents to the emergency department for evaluation of left flank pain.  Symptoms began at 2200 tonight suddenly before bed.  The pain has remained constant and waxes and wanes in severity.  She states that she has had similar pain before, but "never lasting this long".  She denies taking any medications for her symptoms prior to arrival.  No fevers, nausea, vomiting, dysuria, hematuria.  Denies being on her menstrual cycle at present.  No history of abdominal surgeries.     Past Medical History:  Diagnosis Date  . Anemia    hx of  . Anxiety    not on any meds  . Asthma    Albuterol inhaler prn   . History of migraine    last one a week ago  . Insomnia    doesn't take any meds  . Shortness of breath dyspnea    with exertion or laying straight back    Patient Active Problem List   Diagnosis Date Noted  . Mild preeclampsia delivered 03/09/2013  . Vaginal delivery 03/08/2013  . Active labor 03/07/2013  . Anemia 03/07/2013  . 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 Cigarette smoker 02/10/2013  . Pregnant state, incidental 02/10/2013    Past Surgical History:  Procedure Laterality Date  . FOOT SURGERY    . LIPOMA EXCISION Left 03/14/2015   Procedure: EXCISION OF LEFT ANTECUBITAL PHLEBOLITH;  Surgeon: Manus Rudd, MD;  Location: [facility] OR;  Service: General;  Laterality: Left;  . WISDOM TOOTH EXTRACTION  2009     OB History    Gravida  1   Para  1   Term  1   Preterm  0   AB  0   Living  1     SAB  0   TAB  0   Ectopic  0   Multiple  0   Live Births  1            Home Medications    Prior to Admission  medications   Medication Sig Start Date End Date Taking? Authorizing Provider  albuterol (PROVENTIL HFA;VENTOLIN HFA) 108 (90 BASE) MCG/ACT inhaler Inhale 2 puffs into the lungs every 4 (four) hours as needed for wheezing or shortness of breath. Patient not taking: Reported on 06/27/2018 06/05/15   Horton, Mayer Masker, MD  HYDROcodone-acetaminophen (NORCO/VICODIN) 5-325 MG per tablet Take 1 tablet by mouth every 4 (four) hours as needed. Patient not taking: Reported on 06/27/2018 03/14/15   Manus Rudd, MD  methocarbamol (ROBAXIN) 500 MG tablet Take 1 tablet (500 mg total) by mouth every 12 (twelve) hours as needed for muscle spasms. 06/27/18   Antony Madura, PA-C  naproxen (NAPROSYN) 375 MG tablet Take 1 tablet (375 mg total) by mouth 2 (two) times daily. 06/27/18   Antony Madura, PA-C    Family History Family History  Problem Relation Age of Onset  . Asthma Other   . Asthma Other   . Asthma Sister   . Asthma Brother   . Other Neg Hx   . Alcohol abuse Neg Hx   . Arthritis Neg Hx   . Birth defects Neg Hx   .  Cancer Neg Hx   . COPD Neg Hx   . Depression Neg Hx   . Diabetes Neg Hx   . Drug abuse Neg Hx   . Early death Neg Hx   . Hearing loss Neg Hx   . Heart disease Neg Hx   . Hyperlipidemia Neg Hx   . Hypertension Neg Hx   . Kidney disease Neg Hx   . Learning disabilities Neg Hx   . Mental illness Neg Hx   . Mental retardation Neg Hx   . Miscarriages / Stillbirths Neg Hx   . Stroke Neg Hx   . Vision loss Neg Hx     Social History Social History   Tobacco Use  . Smoking status: Current Every Day Smoker    Packs/day: 0.10    Years: 4.00    Pack years: 0.40    Types: Cigars  . Smokeless tobacco: Never Used  . Tobacco comment: 2 cigs/day  Substance Use Topics  . Alcohol use: No  . Drug use: No    Frequency: 2.0 times per week     Allergies   Patient has no known allergies.   Review of Systems Review of Systems Ten systems reviewed and are negative for acute change,  except as noted in the HPI.    Physical Exam Updated Vital Signs BP 113/69   Pulse 72   Temp 98.7 F (37.1 C) (Oral)   Resp 18   SpO2 99%   Physical Exam  Constitutional: She is oriented to person, place, and time. She appears well-developed and well-nourished. No distress.  Nontoxic appearing and in NAD  HENT:  Head: Normocephalic and atraumatic.  Eyes: Conjunctivae and EOM are normal. No scleral icterus.  Neck: Normal range of motion.  Cardiovascular: Normal rate, regular rhythm and intact distal pulses.  Pulmonary/Chest: Effort normal. No respiratory distress.  Respirations even and unlabored.  Abdominal:  Soft abdomen. No reproducible TTP to the left flank.  Musculoskeletal: Normal range of motion. She exhibits no tenderness (no TTP to the lumbar paraspinal muscles.).  Neurological: She is alert and oriented to person, place, and time. She exhibits normal muscle tone. Coordination normal.  GCS 15. Patient moving all extremities.  Skin: Skin is warm and dry. No rash noted. She is not diaphoretic. No erythema. No pallor.  Psychiatric: She has a normal mood and affect. Her behavior is normal.  Nursing note and vitals reviewed.    ED Treatments / Results  Labs (all labs ordered are listed, but only abnormal results are displayed) Labs Reviewed  URINALYSIS, ROUTINE W REFLEX MICROSCOPIC - Abnormal; Notable for the following components:      Result Value   APPearance HAZY (*)    Hgb urine dipstick LARGE (*)    Bacteria, UA RARE (*)    All other components within normal limits  POC URINE PREG, ED    EKG None  Radiology Ct Renal Stone Study  Result Date: 06/27/2018 CLINICAL DATA:  27 year old female with sudden onset left flank pain at 2200 hours. Denies symptoms EXAM: CT ABDOMEN AND PELVIS WITHOUT CONTRAST TECHNIQUE: Multidetector CT imaging of the abdomen and pelvis was performed following the standard protocol without IV contrast. COMPARISON:  CT Abdomen and Pelvis  04/17/2017. FINDINGS: Lower chest: Normal lung bases.  No pericardial or pleural effusion. Hepatobiliary: Negative noncontrast liver and gallbladder. Pancreas: Negative. Spleen: Negative. Adrenals/Urinary Tract: Normal adrenal glands. Noncontrast appearance of both kidneys is within normal limits. Renal size and configuration appears stable. No nephrolithiasis, hydronephrosis,  or perinephric stranding. Decompressed proximal left ureter. Unchanged small bilateral pelvic phleboliths since 2018. Otherwise negative course of both ureters. Stable and unremarkable urinary bladder. Stomach/Bowel: Negative rectum. Negative sigmoid colon aside from mild redundancy. Mildly redundant splenic flexure. Gas-filled but nondilated transverse colon. Mildly redundant hepatic flexure and retained stool in the colon. Normal appendix (series 3, image 57). Negative terminal ileum. Intermittently fluid-filled but nondilated small bowel in the abdomen and pelvis. No small bowel inflammation is evident. Negative stomach and duodenum. No abdominal free air, free fluid. Vascular/Lymphatic: Vascular patency is not evaluated in the absence of IV contrast. No calcified atherosclerosis. Stable lymph nodes, no lymphadenopathy. Reproductive: Negative noncontrast appearance. Other: Trace if any pelvic free fluid. Musculoskeletal: Negative. IMPRESSION: Negative noncontrast CT Abdomen and Pelvis. No urologic calculus or inflammatory process identified. Electronically Signed   By: Odessa Fleming M.D.   On: 06/27/2018 07:11    Procedures Procedures (including critical care time)  Medications Ordered in ED Medications  ketorolac (TORADOL) injection 60 mg (60 mg Intramuscular Given 06/27/18 0609)     Initial Impression / Assessment and Plan / ED Course  I have reviewed the triage vital signs and the nursing notes.  Pertinent labs & imaging results that were available during my care of the patient were reviewed by me and considered in my medical  decision making (see chart for details).     Patient with back pain.  No neurological deficits.  Ambulatory without difficulty.  No loss of bowel or bladder control.  No concern for cauda equina.  Hematuria raised concern for kidney stone, but no evidence of stone on CT scan.  No UTI today.  Pregnancy negative.  RICE protocol and pain medicine indicated and discussed with patient.  Encouraged PCP follow up.  Return precautions discussed and provided. Patient discharged in stable condition with no unaddressed concerns.   Final Clinical Impressions(s) / ED Diagnoses   Final diagnoses:  Left flank pain  Other microscopic hematuria    ED Discharge Orders        Ordered    naproxen (NAPROSYN) 375 MG tablet  2 times daily     06/27/18 0719    methocarbamol (ROBAXIN) 500 MG tablet  Every 12 hours PRN     06/27/18 0719       Antony Madura, PA-C 06/27/18 1610    Dione Booze, MD 06/27/18 [phone]

## 2018-06-27 NOTE — ED Triage Notes (Signed)
Pt states that around 10 pm she has sudden onset of L flank pain with no urinary symptoms or nausea.

## 2018-06-27 NOTE — Discharge Instructions (Signed)
Alternate ice and heat to areas of injury 3-4 times per day to limit inflammation and spasm.  Avoid strenuous activity and heavy lifting.  We recommend consistent use of naproxen in addition to Robaxin for muscle spasms. Do not drive or drink alcohol after taking Robaxin as it may make you drowsy and impair your judgment.  We recommend follow-up with a primary care doctor to ensure resolution of symptoms.  Return to the ED for any new or concerning symptoms. 

## 2018-08-15 ENCOUNTER — Emergency Department (HOSPITAL_BASED_OUTPATIENT_CLINIC_OR_DEPARTMENT_OTHER)
Admission: EM | Admit: 2018-08-15 | Discharge: 2018-08-15 | Disposition: A | Discharge status: Home / Self Care | Payer: Medicaid Other | Attending: Emergency Medicine | Admitting: Emergency Medicine

## 2018-08-15 ENCOUNTER — Other Ambulatory Visit: Payer: Self-pay

## 2018-08-15 ENCOUNTER — Encounter (HOSPITAL_BASED_OUTPATIENT_CLINIC_OR_DEPARTMENT_OTHER): Payer: Self-pay

## 2018-08-15 DIAGNOSIS — Z5321 Procedure and treatment not carried out due to patient leaving prior to being seen by health care provider: Secondary | ICD-10-CM | POA: Insufficient documentation

## 2018-08-15 DIAGNOSIS — R030 Elevated blood-pressure reading, without diagnosis of hypertension: Secondary | ICD-10-CM | POA: Diagnosis present

## 2018-08-15 NOTE — ED Notes (Signed)
Pt was advised current BP is WNL-states she is not waiting to be seen

## 2018-08-15 NOTE — ED Triage Notes (Signed)
Pt states her BP was elevated x 2 at dentist office today-was advised to come to ED-[facility]steady gait

## 2019-07-24 ENCOUNTER — Ambulatory Visit (HOSPITAL_COMMUNITY)
Admission: EM | Admit: 2019-07-24 | Discharge: 2019-07-24 | Disposition: A | Discharge status: Home / Self Care | Payer: Medicaid Other | Attending: Family Medicine | Admitting: Family Medicine

## 2019-07-24 ENCOUNTER — Other Ambulatory Visit: Payer: Self-pay

## 2019-07-24 ENCOUNTER — Encounter (HOSPITAL_COMMUNITY): Payer: Self-pay | Admitting: Emergency Medicine

## 2019-07-24 DIAGNOSIS — B353 Tinea pedis: Secondary | ICD-10-CM | POA: Diagnosis not present

## 2019-07-24 MED ORDER — TERBINAFINE HCL 1 % EX CREA: 1.0000 "application " | TOPICAL_CREAM | Freq: Two times a day (BID) | CUTANEOUS | 0 refills | Status: DC

## 2019-07-24 NOTE — ED Provider Notes (Signed)
[facility]    CSN: [id number] Arrival date & time: 07/24/19  1427      History   Chief Complaint Chief Complaint  Patient presents with  . Appointment  . Blister    HPI Tiffany [name] is a 28 y.o. female.   Pt is a 28 year old female that presents with "blisters" to the left foot that has been intermittent for 2 months.  Reporting that the areas will pop up and then typically resolve on the on leaving circular hyperpigmentation.  The areas are somewhat itchy at times.  Today she is here because of one specific large blister that has formed in is painful when she wears her shoes.  This 1 is been there for the past couple days and worsening.  Denies any specific problems with the right foot.  No injuries to the foot.  Reporting that she typically wears loosefitting shoes with socks that do not rub.  No known insect bites new products to the foot or fever.  ROS per HPI      Past Medical History:  Diagnosis Date  . Anemia    hx of  . Anxiety    not on any meds  . Asthma    Albuterol inhaler prn   . History of migraine    last one a week ago  . Insomnia    doesn't take any meds  . Shortness of breath dyspnea    with exertion or laying straight back    Patient Active Problem List   Diagnosis Date Noted  . Mild preeclampsia delivered 03/09/2013  . Vaginal delivery 03/08/2013  . Active labor 03/07/2013  . Anemia 03/07/2013  . 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 Cigarette smoker 02/10/2013  . Pregnant state, incidental 02/10/2013    Past Surgical History:  Procedure Laterality Date  . FOOT SURGERY    . LIPOMA EXCISION Left 03/14/2015   Procedure: EXCISION OF LEFT ANTECUBITAL PHLEBOLITH;  Surgeon: Manus RuddMatthew Tsuei, MD;  Location: [facility] OR;  Service: General;  Laterality: Left;  . WISDOM TOOTH EXTRACTION  2009    OB History    Gravida  1   Para  1   Term  1   Preterm  0   AB  0   Living  1     SAB  0   TAB  0   Ectopic  0   Multiple  0   Live Births  1            Home Medications    Prior to Admission medications   Medication Sig Start Date End Date Taking? Authorizing Provider  terbinafine (LAMISIL AT) 1 % cream Apply 1 application topically 2 (two) times daily. 07/24/19   Janace ArisBast, Devan Babino A, NP    Family History Family History  Problem Relation Age of Onset  . Asthma Other   . Asthma Other   . Asthma Sister   . Asthma Brother   . Other Neg Hx   . Alcohol abuse Neg Hx   . Arthritis Neg Hx   . Birth defects Neg Hx   . Cancer Neg Hx   . COPD Neg Hx   . Depression Neg Hx   . Diabetes Neg Hx   . Drug abuse Neg Hx   . Early death Neg Hx   . Hearing loss Neg Hx   . Heart disease Neg Hx   . Hyperlipidemia Neg Hx   . Hypertension Neg Hx   .  Kidney disease Neg Hx   . Learning disabilities Neg Hx   . Mental illness Neg Hx   . Mental retardation Neg Hx   . Miscarriages / Stillbirths Neg Hx   . Stroke Neg Hx   . Vision loss Neg Hx     Social History Social History   Tobacco Use  . Smoking status: Current Every Day Smoker    Packs/day: 0.10    Years: 4.00    Pack years: 0.40    Types: Cigars  . Smokeless tobacco: Never Used  . Tobacco comment: 2 cigs/day  Substance Use Topics  . Alcohol use: No  . Drug use: Yes    Types: Marijuana     Allergies   Patient has no known allergies.   Review of Systems Review of Systems   Physical Exam Triage Vital Signs ED Triage Vitals  Enc Vitals Group     BP 07/24/19 1443 126/76     Pulse Rate 07/24/19 1443 98     Resp 07/24/19 1443 (!) 6     Temp 07/24/19 1443 99.1 F (37.3 C)     Temp Source 07/24/19 1443 Temporal     SpO2 07/24/19 1443 97 %     Weight --      Height --      Head Circumference --      Peak Flow --      Pain Score 07/24/19 1440 6     Pain Loc --      Pain Edu? --      Excl. in [place]? --    No data found.  Updated Vital Signs BP 126/76   Pulse 98   Temp 99.1 F (37.3 C) (Temporal)   Resp (!) 6    SpO2 97%   Visual Acuity Right Eye Distance:   Left Eye Distance:   Bilateral Distance:    Right Eye Near:   Left Eye Near:    Bilateral Near:     Physical Exam Vitals signs and nursing note reviewed.  Constitutional:      General: She is not in acute distress.    Appearance: Normal appearance. She is not ill-appearing, toxic-appearing or diaphoretic.  HENT:     Head: Normocephalic.     Nose: Nose normal.     Mouth/Throat:     Pharynx: Oropharynx is clear.  Eyes:     Conjunctiva/sclera: Conjunctivae normal.  Neck:     Musculoskeletal: Normal range of motion.  Pulmonary:     Effort: Pulmonary effort is normal.  Musculoskeletal: Normal range of motion.  Skin:    General: Skin is warm and dry.     Findings: Rash present.     Comments: See picture for detail  Neurological:     Mental Status: She is alert.  Psychiatric:        Mood and Affect: Mood normal.        UC Treatments / Results  Labs (all labs ordered are listed, but only abnormal results are displayed) Labs Reviewed - No data to display  EKG   Radiology No results found.  Procedures Procedures (including critical care time)  Medications Ordered in UC Medications - No data to display  Initial Impression / Assessment and Plan / UC Course  I have reviewed the triage vital signs and the nursing notes.  Pertinent labs & imaging results that were available during my care of the patient were reviewed by me and considered in my medical decision making (see chart for  details).     Rash appears to be some sort of pustular tinea pedis.  Discussed this with Dr. Delton SeeNelson.  Exam consistent with this. Will treat with terbinafine cream twice a day until the rash has resolved.  Recommended for continued or worsening problems to follow-up in person and she may end up needing oral antifungal.  Final Clinical Impressions(s) / UC Diagnoses   Final diagnoses:  Tinea pedis, left     Discharge Instructions      I believe this is pustular tinea or foot fungus Use the Lamisil as prescribed For continued or worse problems I would follow up with podiatry.      ED Prescriptions    Medication Sig Dispense Auth. Provider   terbinafine (LAMISIL AT) 1 % cream Apply 1 application topically 2 (two) times daily. 30 g Dahlia ByesBast, Daemyn Gariepy A, NP     Controlled Substance Prescriptions [place] Controlled Substance Registry consulted? Not Applicable   Janace ArisBast, Tatum Corl A, NP 07/25/19 [phone]

## 2019-07-24 NOTE — ED Triage Notes (Signed)
PT has a large blister on side of left foot. PT has been getting multiple "bumps / blisters" on left foot for the past 2 months.

## 2019-07-24 NOTE — Discharge Instructions (Addendum)
I believe this is pustular tinea or foot fungus Use the Lamisil as prescribed For continued or worse problems I would follow up with podiatry.

## 2019-09-25 ENCOUNTER — Other Ambulatory Visit: Payer: Self-pay

## 2019-09-25 ENCOUNTER — Encounter (HOSPITAL_BASED_OUTPATIENT_CLINIC_OR_DEPARTMENT_OTHER): Payer: Self-pay

## 2019-09-25 ENCOUNTER — Emergency Department (HOSPITAL_BASED_OUTPATIENT_CLINIC_OR_DEPARTMENT_OTHER): Payer: Medicaid Other

## 2019-09-25 ENCOUNTER — Emergency Department (HOSPITAL_BASED_OUTPATIENT_CLINIC_OR_DEPARTMENT_OTHER)
Admission: EM | Admit: 2019-09-25 | Discharge: 2019-09-25 | Disposition: A | Discharge status: Home / Self Care | Payer: Medicaid Other | Attending: Emergency Medicine | Admitting: Emergency Medicine

## 2019-09-25 DIAGNOSIS — Z20828 Contact with and (suspected) exposure to other viral communicable diseases: Secondary | ICD-10-CM | POA: Diagnosis not present

## 2019-09-25 DIAGNOSIS — F1210 Cannabis abuse, uncomplicated: Secondary | ICD-10-CM | POA: Insufficient documentation

## 2019-09-25 DIAGNOSIS — F17210 Nicotine dependence, cigarettes, uncomplicated: Secondary | ICD-10-CM | POA: Insufficient documentation

## 2019-09-25 DIAGNOSIS — R05 Cough: Secondary | ICD-10-CM | POA: Diagnosis present

## 2019-09-25 DIAGNOSIS — Z20822 Contact with and (suspected) exposure to covid-19: Secondary | ICD-10-CM

## 2019-09-25 DIAGNOSIS — J45909 Unspecified asthma, uncomplicated: Secondary | ICD-10-CM | POA: Insufficient documentation

## 2019-09-25 LAB — GROUP A STREP BY PCR: Group A Strep by PCR: NOT DETECTED

## 2019-09-25 MED ORDER — BENZONATATE 100 MG PO CAPS: 100.0000 mg | ORAL_CAPSULE | Freq: Three times a day (TID) | ORAL | 0 refills | Status: AC

## 2019-09-25 MED ORDER — ALBUTEROL SULFATE HFA 108 (90 BASE) MCG/ACT IN AERS: 1.0000 | INHALATION_SPRAY | Freq: Four times a day (QID) | RESPIRATORY_TRACT | 0 refills | Status: AC | PRN

## 2019-09-25 NOTE — ED Triage Notes (Addendum)
Pt c/o flu like sx x 3 days with +covid coworker exposure-pt states she has to be tested prior to returning to [facility] gait

## 2019-09-25 NOTE — Discharge Instructions (Addendum)
You were give an albuterol inhaler for your symptoms. Please use two puff of the inhaler every 4-6 hours as needed for shortness of breath or cough. You were also given cough medication. Take as directed. Rotate tylenol and motrin to treat your fevers and body aches. Stay well hydrated.  ° °Today you were were tested for the coronavirus.  The results will be available in the next 3 to 5 days.  If the results are positive the hospital will contact you.  If they are negative the hospital would not contact you.  You will need to self quarantine until you are aware of your results.  If they are positive you will need to self quarantine as directed below. ° °You should be isolated for at least 7 days since the onset of your symptoms AND >72 hours after symptoms resolution (absence of fever without the use of fever reducing medication and improvement in respiratory symptoms), whichever is longer ° °Please follow up with your primary care provider within 5-7 days for re-evaluation of your symptoms. If you do not have a primary care provider, information for a healthcare clinic has been provided for you to make arrangements for follow up care. Please return to the emergency department for any new or worsening symptoms. ° °

## 2019-09-25 NOTE — ED Provider Notes (Addendum)
[facility] EMERGENCY DEPARTMENT Provider Note   CSN: [id number] Arrival date & time: 09/25/19  1706     History   Chief Complaint Chief Complaint  Patient presents with  . Cough    HPI Tiffany [name] is a [age] y.o. female.     HPI   Pt is a 28 y/o female with a h/o anemia, anxiety, asthma, migaines, who presents to the ED today for eval fatigue, sore throat, cough productive of clear/green sputum. She report some intermittent shortness of breath. She c/o upper chest pain that seems to happen when she coughs. She reports rhinorrhea and congestion that has since improved. Denies fevers.   Past Medical History:  Diagnosis Date  . Anemia    hx of  . Anxiety    not on any meds  . Asthma    Albuterol inhaler prn   . History of migraine    last one a week ago  . Insomnia    doesn't take any meds  . Shortness of breath dyspnea    with exertion or laying straight back    Patient Active Problem List   Diagnosis Date Noted  . Mild preeclampsia delivered 03/09/2013  . Vaginal delivery 03/08/2013  . Active labor 03/07/2013  . Anemia 03/07/2013  . 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 Cigarette smoker 02/10/2013  . Pregnant state, incidental 02/10/2013    Past Surgical History:  Procedure Laterality Date  . FOOT SURGERY    . LIPOMA EXCISION Left 03/14/2015   Procedure: EXCISION OF LEFT ANTECUBITAL PHLEBOLITH;  Surgeon: Manus RuddMatthew Tsuei, MD;  Location: [facility] OR;  Service: General;  Laterality: Left;  . WISDOM TOOTH EXTRACTION  2009     OB History    Gravida  1   Para  1   Term  1   Preterm  0   AB  0   Living  1     SAB  0   TAB  0   Ectopic  0   Multiple  0   Live Births  1            Home Medications    Prior to Admission medications   Medication Sig Start Date End Date Taking? Authorizing Provider  albuterol (VENTOLIN HFA) 108 (90 Base) MCG/ACT inhaler Inhale 1-2 puffs into the lungs every 6 (six) hours  as needed for wheezing or shortness of breath. 09/25/19   Rhodes Calvert S, PA-C  benzonatate (TESSALON) 100 MG capsule Take 1 capsule (100 mg total) by mouth every 8 (eight) hours for 5 days. 09/25/19 09/30/19  Harpreet Signore S, PA-C  terbinafine (LAMISIL AT) 1 % cream Apply 1 application topically 2 (two) times daily. 07/24/19   Janace ArisBast, Traci A, NP    Family History Family History  Problem Relation Age of Onset  . Asthma Other   . Asthma Other   . Asthma Sister   . Asthma Brother   . Other Neg Hx   . Alcohol abuse Neg Hx   . Arthritis Neg Hx   . Birth defects Neg Hx   . Cancer Neg Hx   . COPD Neg Hx   . Depression Neg Hx   . Diabetes Neg Hx   . Drug abuse Neg Hx   . Early death Neg Hx   . Hearing loss Neg Hx   . Heart disease Neg Hx   . Hyperlipidemia Neg Hx   . Hypertension Neg Hx   . Kidney  disease Neg Hx   . Learning disabilities Neg Hx   . Mental illness Neg Hx   . Mental retardation Neg Hx   . Miscarriages / Stillbirths Neg Hx   . Stroke Neg Hx   . Vision loss Neg Hx     Social History Social History   Tobacco Use  . Smoking status: Current Every Day Smoker    Packs/day: 0.10    Years: 4.00    Pack years: 0.40    Types: Cigars  . Smokeless tobacco: Never Used  . Tobacco comment: 2 cigs/day  Substance Use Topics  . Alcohol use: No  . Drug use: Yes    Types: Marijuana     Allergies   Patient has no known allergies.   Review of Systems Review of Systems  Constitutional: Negative for fever.  HENT: Positive for congestion, rhinorrhea and sore throat. Negative for ear pain.   Eyes: Negative for visual disturbance.  Respiratory: Positive for cough and shortness of breath.   Cardiovascular: Positive for chest pain (with cough only). Negative for palpitations.  Gastrointestinal: Negative for abdominal pain, constipation, diarrhea, nausea and vomiting.  Genitourinary: Negative for dysuria and hematuria.  Musculoskeletal: Positive for myalgias.  Skin:  Negative for rash.  Neurological: Negative for seizures, syncope and headaches.  All other systems reviewed and are negative.    Physical Exam Updated Vital Signs BP 133/80 (BP Location: Left Arm)   Pulse 94   Temp 98.8 F (37.1 C) (Oral)   Resp 20   Ht 5\' 4"  (1.626 m)   Wt 78 kg   LMP 07/26/2019 (Approximate)   SpO2 99%   BMI 29.52 kg/m   Physical Exam Vitals signs and nursing note reviewed.  Constitutional:      General: She is not in acute distress.    Appearance: She is well-developed.  HENT:     Head: Normocephalic and atraumatic.     Mouth/Throat:     Mouth: Mucous membranes are moist.     Pharynx: No oropharyngeal exudate or posterior oropharyngeal erythema.     Comments: No tonsillar hypertrophy, erythema, or edema Eyes:     Conjunctiva/sclera: Conjunctivae normal.  Neck:     Musculoskeletal: Neck supple.  Cardiovascular:     Rate and Rhythm: Regular rhythm. Tachycardia present.     Heart sounds: Normal heart sounds. No murmur.  Pulmonary:     Effort: Pulmonary effort is normal. No respiratory distress.     Breath sounds: Normal breath sounds. No wheezing, rhonchi or rales.  Abdominal:     General: Bowel sounds are normal.     Palpations: Abdomen is soft.     Tenderness: There is no abdominal tenderness. There is no rebound.  Skin:    General: Skin is warm and dry.  Neurological:     Mental Status: She is alert.      ED Treatments / Results  Labs (all labs ordered are listed, but only abnormal results are displayed) Labs Reviewed  GROUP A STREP BY PCR  NOVEL CORONAVIRUS, NAA (HOSP ORDER, SEND-OUT TO REF LAB; TAT 18-24 HRS)    EKG None  Radiology Dg Chest Portable 1 View  Result Date: 09/25/2019 CLINICAL DATA:  Cough EXAM: PORTABLE CHEST 1 VIEW COMPARISON:  12/05/2014 FINDINGS: The heart size and mediastinal contours are within normal limits. Both lungs are clear. The visualized skeletal structures are unremarkable. IMPRESSION: No active  disease. Electronically Signed   By: Ulyses Jarred M.D.   On: 09/25/2019 19:00  Procedures Procedures (including critical care time)  Medications Ordered in ED Medications - No data to display   Initial Impression / Assessment and Plan / ED Course  I have reviewed the triage vital signs and the nursing notes.  Pertinent labs & imaging results that were available during my care of the patient were reviewed by me and considered in my medical decision making (see chart for details).    Final Clinical Impressions(s) / ED Diagnoses   Final diagnoses:  Suspected COVID-19 virus infection    Pt with uri sxs x 4 days. Out of window for tamiflu therefore flu testing deferred. Pt CXR negative for acute infiltrate. Strep test negative. Patients symptoms are consistent with URI, likely viral etiology. In light of COVID pandemic, advised self quarantine. Discussed that antibiotics are not indicated for viral infections. Pt will be discharged with symptomatic treatment.  Verbalizes understanding and is agreeable with plan. Pt is hemodynamically stable & in NAD prior to dc.  Tiffany [name] was evaluated in Emergency Department on 09/25/2019 for the symptoms described in the history of present illness. She was evaluated in the context of the global COVID-19 pandemic, which necessitated consideration that the patient might be at risk for infection with the SARS-CoV-2 virus that causes COVID-19. Institutional protocols and algorithms that pertain to the evaluation of patients at risk for COVID-19 are in a state of rapid change based on information released by regulatory bodies including the CDC and federal and state organizations. These policies and algorithms were followed during the patient's care in the ED.   ED Discharge Orders         Ordered    benzonatate (TESSALON) 100 MG capsule  Every 8 hours     09/25/19 1855    albuterol (VENTOLIN HFA) 108 (90 Base) MCG/ACT inhaler  Every 6 hours PRN      09/25/19 1855           Karrie Meres, PA-C 09/25/19 1856    Karrie Meres, PA-C 09/25/19 1906    Alvira Monday, MD 09/26/19 1257

## 2019-09-27 LAB — NOVEL CORONAVIRUS, NAA (HOSP ORDER, SEND-OUT TO REF LAB; TAT 18-24 HRS): SARS-CoV-2, NAA: NOT DETECTED

## 2019-11-20 ENCOUNTER — Other Ambulatory Visit: Payer: Self-pay

## 2019-11-20 ENCOUNTER — Encounter (HOSPITAL_BASED_OUTPATIENT_CLINIC_OR_DEPARTMENT_OTHER): Payer: Self-pay

## 2019-11-20 ENCOUNTER — Emergency Department (HOSPITAL_BASED_OUTPATIENT_CLINIC_OR_DEPARTMENT_OTHER)
Admission: EM | Admit: 2019-11-20 | Discharge: 2019-11-20 | Disposition: A | Discharge status: Home / Self Care | Payer: Medicaid Other | Attending: Emergency Medicine | Admitting: Emergency Medicine

## 2019-11-20 DIAGNOSIS — F17210 Nicotine dependence, cigarettes, uncomplicated: Secondary | ICD-10-CM | POA: Diagnosis not present

## 2019-11-20 DIAGNOSIS — B9689 Other specified bacterial agents as the cause of diseases classified elsewhere: Secondary | ICD-10-CM | POA: Insufficient documentation

## 2019-11-20 DIAGNOSIS — N760 Acute vaginitis: Secondary | ICD-10-CM | POA: Diagnosis not present

## 2019-11-20 DIAGNOSIS — N939 Abnormal uterine and vaginal bleeding, unspecified: Secondary | ICD-10-CM | POA: Diagnosis not present

## 2019-11-20 DIAGNOSIS — Z79899 Other long term (current) drug therapy: Secondary | ICD-10-CM | POA: Insufficient documentation

## 2019-11-20 LAB — CBC
HCT: 40 % (ref 36.0–46.0)
Hemoglobin: 13.5 g/dL (ref 12.0–15.0)
MCH: 32.8 pg (ref 26.0–34.0)
MCHC: 33.8 g/dL (ref 30.0–36.0)
MCV: 97.3 fL (ref 80.0–100.0)
Platelets: 230 10*3/uL (ref 150–400)
RBC: 4.11 MIL/uL (ref 3.87–5.11)
RDW: 13.8 % (ref 11.5–15.5)
WBC: 7.6 10*3/uL (ref 4.0–10.5)
nRBC: 0 % (ref 0.0–0.2)

## 2019-11-20 LAB — PREGNANCY, URINE: Preg Test, Ur: NEGATIVE

## 2019-11-20 LAB — URINALYSIS, ROUTINE W REFLEX MICROSCOPIC
Bilirubin Urine: NEGATIVE
Glucose, UA: NEGATIVE mg/dL
Ketones, ur: NEGATIVE mg/dL
Leukocytes,Ua: NEGATIVE
Nitrite: NEGATIVE
Protein, ur: NEGATIVE mg/dL
Specific Gravity, Urine: 1.025 (ref 1.005–1.030)
pH: 5.5 (ref 5.0–8.0)

## 2019-11-20 LAB — WET PREP, GENITAL
Sperm: NONE SEEN
Trich, Wet Prep: NONE SEEN
Yeast Wet Prep HPF POC: NONE SEEN

## 2019-11-20 LAB — BASIC METABOLIC PANEL
Anion gap: 7 (ref 5–15)
BUN: 5 mg/dL — ABNORMAL LOW (ref 6–20)
CO2: 22 mmol/L (ref 22–32)
Calcium: 9.3 mg/dL (ref 8.9–10.3)
Chloride: 107 mmol/L (ref 98–111)
Creatinine, Ser: 0.66 mg/dL (ref 0.44–1.00)
GFR calc Af Amer: >60 mL/min (ref >60)
GFR calc non Af Amer: >60 mL/min (ref >60)
Glucose, Bld: 82 mg/dL (ref 70–99)
Potassium: 3.8 mmol/L (ref 3.5–5.1)
Sodium: 136 mmol/L (ref 135–145)

## 2019-11-20 LAB — HIV ANTIBODY (ROUTINE TESTING W REFLEX): HIV Screen 4th Generation wRfx: NONREACTIVE

## 2019-11-20 LAB — URINALYSIS, MICROSCOPIC (REFLEX)

## 2019-11-20 MED ORDER — METRONIDAZOLE 500 MG PO TABS: 500.0000 mg | ORAL_TABLET | Freq: Two times a day (BID) | ORAL | 0 refills | Status: DC

## 2019-11-20 NOTE — ED Triage Notes (Signed)
Pt states began period Saturday, much heavier than usual, going through tampon every hour or two.  Has been using condoms for birth control.  Low abdominal cramping.

## 2019-11-20 NOTE — Discharge Instructions (Addendum)
Take Flagyl as prescribed.  Do not drink alcohol with this medicine, as it can affect your liver. Make sure you stay well-hydrated water. Follow-up with your OB/GYN for further evaluation and management of your vaginal bleeding. Return to the emergency room if you develop any new, worsening, concerning symptoms.

## 2019-11-20 NOTE — ED Provider Notes (Signed)
[facility] EMERGENCY DEPARTMENT Provider Note   CSN: [id number] Arrival date & time: 11/20/19  [id number]     History Chief Complaint  Patient presents with  . Vaginal Bleeding    Tiffany [name] is a 28 y.o. female presenting for evaluation of vaginal bleeding and lower abdominal cramping.  Patient states her period started several days ago. She noticed larger than normal clots, heavier bleeding, and increased lower abdominal cramping when compared to her normal period. She has been taking Midol without improvement of pain. Today her bleeding and clots are lessened. She used to be on Depo shots, stopped over a year ago. She did not have a period until several months ago, she has had several "normal" periods before this month. She is sexually active, uses condoms. She denies fevers, chills, weakness, dizziness, lightheadedness, chest pain, nausea, vomiting, upper abdominal pain. She denies urinary symptoms. Denies vaginal discharge. She has no medical problems, takes no medications daily. She has not followed up with her OB/GYN regarding this period.   HPI     Past Medical History:  Diagnosis Date  . Anemia    hx of  . Anxiety    not on any meds  . Asthma    Albuterol inhaler prn   . History of migraine    last one a week ago  . Insomnia    doesn't take any meds  . Shortness of breath dyspnea    with exertion or laying straight back    Patient Active Problem List   Diagnosis Date Noted  . Mild preeclampsia delivered 03/09/2013  . Vaginal delivery 03/08/2013  . Active labor 03/07/2013  . Anemia 03/07/2013  . 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 Cigarette smoker 02/10/2013  . Pregnant state, incidental 02/10/2013    Past Surgical History:  Procedure Laterality Date  . FOOT SURGERY    . LIPOMA EXCISION Left 03/14/2015   Procedure: EXCISION OF LEFT ANTECUBITAL PHLEBOLITH;  Surgeon: Manus RuddMatthew Tsuei, MD;  Location: [facility] OR;  Service:  General;  Laterality: Left;  . WISDOM TOOTH EXTRACTION  2009     OB History    Gravida  1   Para  1   Term  1   Preterm  0   AB  0   Living  1     SAB  0   TAB  0   Ectopic  0   Multiple  0   Live Births  1           Family History  Problem Relation Age of Onset  . Asthma Other   . Asthma Other   . Asthma Sister   . Asthma Brother   . Other Neg Hx   . Alcohol abuse Neg Hx   . Arthritis Neg Hx   . Birth defects Neg Hx   . Cancer Neg Hx   . COPD Neg Hx   . Depression Neg Hx   . Diabetes Neg Hx   . Drug abuse Neg Hx   . Early death Neg Hx   . Hearing loss Neg Hx   . Heart disease Neg Hx   . Hyperlipidemia Neg Hx   . Hypertension Neg Hx   . Kidney disease Neg Hx   . Learning disabilities Neg Hx   . Mental illness Neg Hx   . Mental retardation Neg Hx   . Miscarriages / Stillbirths Neg Hx   . Stroke Neg Hx   . Vision loss  Neg Hx     Social History   Tobacco Use  . Smoking status: Current Every Day Smoker    Packs/day: 0.10    Years: 4.00    Pack years: 0.40    Types: Cigars  . Smokeless tobacco: Never Used  . Tobacco comment: 2 cigs/day  Substance Use Topics  . Alcohol use: No  . Drug use: Yes    Types: Marijuana    Home Medications Prior to Admission medications   Medication Sig Start Date End Date Taking? Authorizing Provider  albuterol (VENTOLIN HFA) 108 (90 Base) MCG/ACT inhaler Inhale 1-2 puffs into the lungs every 6 (six) hours as needed for wheezing or shortness of breath. 09/25/19   Couture, Cortni S, PA-C  metroNIDAZOLE (FLAGYL) 500 MG tablet Take 1 tablet (500 mg total) by mouth 2 (two) times daily. 11/20/19   Felma Pfefferle, PA-C  terbinafine (LAMISIL AT) 1 % cream Apply 1 application topically 2 (two) times daily. 07/24/19   Janace Aris, NP    Allergies    Patient has no known allergies.  Review of Systems   Review of Systems  Genitourinary: Positive for pelvic pain and vaginal bleeding.  All other systems reviewed  and are negative.   Physical Exam Updated Vital Signs BP 131/86 (BP Location: Right Arm)   Pulse 91   Temp 98.4 F (36.9 C) (Oral)   Resp 14   SpO2 100%   Physical Exam Vitals and nursing note reviewed. Exam conducted with a chaperone present.  Constitutional:      General: She is not in acute distress.    Appearance: She is well-developed.     Comments: Resting comfortably in the bed in no acute distress. No obvious pallor.  HENT:     Head: Normocephalic and atraumatic.  Eyes:     Conjunctiva/sclera: Conjunctivae normal.     Pupils: Pupils are equal, round, and reactive to light.  Cardiovascular:     Rate and Rhythm: Normal rate and regular rhythm.     Pulses: Normal pulses.  Pulmonary:     Effort: Pulmonary effort is normal. No respiratory distress.     Breath sounds: Normal breath sounds. No wheezing.  Abdominal:     General: There is no distension.     Palpations: Abdomen is soft. There is no mass.     Tenderness: There is abdominal tenderness. There is no guarding or rebound.     Comments: Mild tenderness palpation of lower abdomen/pelvic area. No rigidity, guarding, distention. Negative rebound. No signs of peritonitis.  Genitourinary:    Vagina: Normal.     Cervix: Cervical bleeding present. No cervical motion tenderness or friability.     Uterus: Not tender.      Adnexa:        Right: No tenderness.         Left: No tenderness.       Comments: Mild blood noted in the vaginal canal. No clots. No CMT or adnexal tenderness. No obvious masses. Musculoskeletal:        General: Normal range of motion.     Cervical back: Normal range of motion and neck supple.  Skin:    General: Skin is warm and dry.     Capillary Refill: Capillary refill takes less than 2 seconds.  Neurological:     Mental Status: She is alert and oriented to person, place, and time.     ED Results / Procedures / Treatments   Labs (all labs ordered are listed,  but only abnormal results are  displayed) Labs Reviewed  WET PREP, GENITAL - Abnormal; Notable for the following components:      Result Value   Clue Cells Wet Prep HPF POC PRESENT (*)    WBC, Wet Prep HPF POC FEW (*)    All other components within normal limits  URINALYSIS, ROUTINE W REFLEX MICROSCOPIC - Abnormal; Notable for the following components:   Hgb urine dipstick LARGE (*)    All other components within normal limits  BASIC METABOLIC PANEL - Abnormal; Notable for the following components:   BUN 5 (*)    All other components within normal limits  URINALYSIS, MICROSCOPIC (REFLEX) - Abnormal; Notable for the following components:   Bacteria, UA FEW (*)    All other components within normal limits  PREGNANCY, URINE  CBC  HIV ANTIBODY (ROUTINE TESTING W REFLEX)  GC/CHLAMYDIA PROBE AMP ([place]) NOT AT [facility]    EKG None  Radiology No results found.  Procedures Procedures (including critical care time)  Medications Ordered in ED Medications - No data to display  ED Course  I have reviewed the triage vital signs and the nursing notes.  Pertinent labs & imaging results that were available during my care of the patient were reviewed by me and considered in my medical decision making (see chart for details).    MDM Rules/Calculators/A&P                      Patient presenting for evaluation of vaginal bleeding and lower abdominal cramping.  Physical exam shows patient appears nontoxic.  Vitals are stable, doubt significant anemia.  However as patient is reporting increased bleeding, will obtain CBC to check hemoglobin.  Pelvic exam showed some blood in the vaginal canal, but no significant hemorrhage.  No clots or masses.  Will obtain pregnancy test, if negative, doubt ectopic or early miscarriage.  Likely heavy menstruation causing increased cramping.  Hemoglobin normal at 13.9.  Wet prep positive for clue cells, discussed with patient will treat for BV.  Pregnancy is negative.  Discussed followed  up with OB/GYN for further evaluation and management.  At this time, patient appears safe for discharge.  Return precautions given.  Patient states she understands and agrees to plan.  Final Clinical Impression(s) / ED Diagnoses Final diagnoses:  Vaginal bleeding  BV (bacterial vaginosis)    Rx / DC Orders ED Discharge Orders         Ordered    metroNIDAZOLE (FLAGYL) 500 MG tablet  2 times daily     11/20/19 1059           Jarris Kortz, PA-C 11/20/19 1102    Gareth Morgan, MD 11/21/19 [phone]

## 2019-11-21 LAB — GC/CHLAMYDIA PROBE AMP (~~LOC~~) NOT AT ARMC
Chlamydia: NEGATIVE
Neisseria Gonorrhea: NEGATIVE

## 2019-12-25 ENCOUNTER — Encounter (HOSPITAL_BASED_OUTPATIENT_CLINIC_OR_DEPARTMENT_OTHER): Payer: Self-pay | Admitting: *Deleted

## 2019-12-25 ENCOUNTER — Emergency Department (HOSPITAL_BASED_OUTPATIENT_CLINIC_OR_DEPARTMENT_OTHER)
Admission: EM | Admit: 2019-12-25 | Discharge: 2019-12-25 | Disposition: A | Discharge status: Home / Self Care | Payer: Medicaid Other | Attending: Emergency Medicine | Admitting: Emergency Medicine

## 2019-12-25 ENCOUNTER — Other Ambulatory Visit: Payer: Self-pay

## 2019-12-25 ENCOUNTER — Emergency Department (HOSPITAL_BASED_OUTPATIENT_CLINIC_OR_DEPARTMENT_OTHER): Payer: Medicaid Other

## 2019-12-25 DIAGNOSIS — Y999 Unspecified external cause status: Secondary | ICD-10-CM | POA: Insufficient documentation

## 2019-12-25 DIAGNOSIS — F17210 Nicotine dependence, cigarettes, uncomplicated: Secondary | ICD-10-CM | POA: Insufficient documentation

## 2019-12-25 DIAGNOSIS — S29012A Strain of muscle and tendon of back wall of thorax, initial encounter: Secondary | ICD-10-CM | POA: Diagnosis not present

## 2019-12-25 DIAGNOSIS — Y939 Activity, unspecified: Secondary | ICD-10-CM | POA: Diagnosis not present

## 2019-12-25 DIAGNOSIS — Y929 Unspecified place or not applicable: Secondary | ICD-10-CM | POA: Diagnosis not present

## 2019-12-25 DIAGNOSIS — X509XXA Other and unspecified overexertion or strenuous movements or postures, initial encounter: Secondary | ICD-10-CM | POA: Insufficient documentation

## 2019-12-25 DIAGNOSIS — J45909 Unspecified asthma, uncomplicated: Secondary | ICD-10-CM | POA: Insufficient documentation

## 2019-12-25 DIAGNOSIS — S299XXA Unspecified injury of thorax, initial encounter: Secondary | ICD-10-CM | POA: Diagnosis present

## 2019-12-25 DIAGNOSIS — T148XXA Other injury of unspecified body region, initial encounter: Secondary | ICD-10-CM

## 2019-12-25 MED ORDER — METHOCARBAMOL 500 MG PO TABS: 500.0000 mg | ORAL_TABLET | Freq: Two times a day (BID) | ORAL | 0 refills | Status: AC

## 2019-12-25 NOTE — Discharge Instructions (Addendum)
I have prescribed muscle relaxers for your pain, please do not drink or drive while taking this medications as they can make you drowsy.  Please follow-up with PCP in 1 week for reevaluation of your symptoms.  You experience any bowel or bladder incontinence, fever, worsening in your symptoms please return to the ED. ° °

## 2019-12-25 NOTE — ED Provider Notes (Signed)
[facility] EMERGENCY DEPARTMENT Provider Note   CSN: [id number] Arrival date & time: 12/25/19  6073     History Chief Complaint  Patient presents with  . Chest Pain    Tiffany [name] is a 29 y.o. female.  29 y.o female with a a PMH of Asthma presents to the ED with a chief complaint of right sided pain which began yesterday while washing her daughter's hair.  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  She reports the pain is along the right flank this is worse with movement of her right arm.  She reports trying to sleep with her right arm over her head which did help some with her symptoms.  Reports the pain is worse with bending and twisting along with reaching over.  She has tried naproxen but did not have much improvement in her symptoms.  She denies any cough, fevers, URI symptoms.  No prior history of blood clots, no urinary symptoms today.  The history is provided by the patient.  Chest Pain Associated symptoms: no fever and no shortness of breath        Past Medical History:  Diagnosis Date  . Anemia    hx of  . Anxiety    not on any meds  . Asthma    Albuterol inhaler prn   . History of migraine    last one a week ago  . Insomnia    doesn't take any meds  . Shortness of breath dyspnea    with exertion or laying straight back    Patient Active Problem List   Diagnosis Date Noted  . Mild preeclampsia delivered 03/09/2013  . Vaginal delivery 03/08/2013  . Active labor 03/07/2013  . Anemia 03/07/2013  . 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 Cigarette smoker 02/10/2013  . Pregnant state, incidental 02/10/2013    Past Surgical History:  Procedure Laterality Date  . FOOT SURGERY    . LIPOMA EXCISION Left 03/14/2015   Procedure: EXCISION OF LEFT ANTECUBITAL PHLEBOLITH;  Surgeon: Manus Rudd, MD;  Location: [facility] OR;  Service: General;  Laterality: Left;  . WISDOM TOOTH  EXTRACTION  2009     OB History    Gravida  1   Para  1   Term  1   Preterm  0   AB  0   Living  1     SAB  0   TAB  0   Ectopic  0   Multiple  0   Live Births  1           Family History  Problem Relation Age of Onset  . Asthma Other   . Asthma Other   . Asthma Sister   . Asthma Brother   . Other Neg Hx   . Alcohol abuse Neg Hx   . Arthritis Neg Hx   . Birth defects Neg Hx   . Cancer Neg Hx   . COPD Neg Hx   . Depression Neg Hx   . Diabetes Neg Hx   . Drug abuse Neg Hx   . Early death Neg Hx   . Hearing loss Neg Hx   . Heart disease Neg Hx   . Hyperlipidemia Neg Hx   . Hypertension Neg Hx   . Kidney disease Neg Hx   . Learning disabilities Neg Hx   . Mental illness Neg Hx   . Mental  retardation Neg Hx   . Miscarriages / Stillbirths Neg Hx   . Stroke Neg Hx   . Vision loss Neg Hx     Social History   Tobacco Use  . Smoking status: Current Every Day Smoker    Packs/day: 0.10    Years: 4.00    Pack years: 0.40    Types: Cigars  . Smokeless tobacco: Never Used  . Tobacco comment: 2 cigs/day  Substance Use Topics  . Alcohol use: No  . Drug use: Yes    Types: Marijuana    Home Medications Prior to Admission medications   Medication Sig Start Date End Date Taking? Authorizing Provider  albuterol (VENTOLIN HFA) 108 (90 Base) MCG/ACT inhaler Inhale 1-2 puffs into the lungs every 6 (six) hours as needed for wheezing or shortness of breath. 09/25/19   Couture, Cortni S, PA-C  methocarbamol (ROBAXIN) 500 MG tablet Take 1 tablet (500 mg total) by mouth 2 (two) times daily for 7 days. 12/25/19 01/01/20  Claude Manges, PA-C  metroNIDAZOLE (FLAGYL) 500 MG tablet Take 1 tablet (500 mg total) by mouth 2 (two) times daily. 11/20/19   Caccavale, Sophia, PA-C  terbinafine (LAMISIL AT) 1 % cream Apply 1 application topically 2 (two) times daily. 07/24/19   Janace Aris, NP    Allergies    Patient has no known allergies.  Review of Systems   Review of  Systems  Constitutional: Negative for fever.  Respiratory: Negative for shortness of breath.   Cardiovascular: Positive for chest pain.  Musculoskeletal: Positive for myalgias.    Physical Exam Updated Vital Signs BP 116/73 (BP Location: Right Arm)   Pulse 83   Temp 98.8 F (37.1 C)   Resp 18   Ht 5\' 5"  (1.651 m)   Wt 75.8 kg   LMP 11/14/2019 (Exact Date)   SpO2 100%   BMI 27.79 kg/m   Physical Exam Vitals and nursing note reviewed.  Constitutional:      General: She is not in acute distress.    Appearance: She is well-developed.  HENT:     Head: Normocephalic and atraumatic.     Mouth/Throat:     Pharynx: No oropharyngeal exudate.  Eyes:     Pupils: Pupils are equal, round, and reactive to light.  Cardiovascular:     Rate and Rhythm: Regular rhythm.     Heart sounds: Normal heart sounds.  Pulmonary:     Effort: Pulmonary effort is normal. No respiratory distress.     Breath sounds: Normal breath sounds.       Comments: Lungs are clear to auscultation there is pain with palpation below the scapula, exacerbated with lifting of the right arm. Abdominal:     General: Bowel sounds are normal. There is no distension.     Palpations: Abdomen is soft.     Tenderness: There is no abdominal tenderness.     Comments: No CVA bilaterally.  Musculoskeletal:        General: No tenderness or deformity.     Cervical back: Normal range of motion.     Right lower leg: No edema.     Left lower leg: No edema.  Skin:    General: Skin is warm and dry.  Neurological:     Mental Status: She is alert and oriented to person, place, and time.     ED Results / Procedures / Treatments   Labs (all labs ordered are listed, but only abnormal results are displayed) Labs Reviewed - No  data to display  EKG EKG Interpretation  Date/Time:  Monday December 25 2019 09:52:24 EST Ventricular Rate:  85 PR Interval:    QRS Duration: 80 QT Interval:  366 QTC Calculation: 436 R  Axis:   53 Text Interpretation: Sinus rhythm Baseline wander in lead(s) V2 Confirmed by Quintella Reichert [phone]) on 12/25/2019 10:00:57 AM   Radiology DG Chest 2 View  Result Date: 12/25/2019 CLINICAL DATA:  Chest pain with inspiration.  Smoking history. EXAM: CHEST - 2 VIEW COMPARISON:  09/25/2019 FINDINGS: The cardiac silhouette, mediastinal and hilar contours are within normal limits and stable. The lungs demonstrate mild hyperinflation but no infiltrates, edema or effusions. No pulmonary lesions. The bony thorax is intact. IMPRESSION: No acute cardiopulmonary findings. Electronically Signed   By: Marijo Sanes M.D.   On: 12/25/2019 10:22    Procedures Procedures (including critical care time)  Medications Ordered in ED Medications - No data to display  ED Course  I have reviewed the triage vital signs and the nursing notes.  Pertinent labs & imaging results that were available during my care of the patient were reviewed by me and considered in my medical decision making (see chart for details).    MDM Rules/Calculators/A&P   Patient the past medical history of asthma presents to the ED with complaints of right-sided flank pain after washing her daughter's hair.  Currently employed at [employer] and does a lot of pulling and pushing, reports she has had worsening symptoms with movement of her right arm.  She reports getting a spasm yesterday where she suddenly felt she had to stop her activity.  She has tried naproxen without improvement in symptoms, did try sleeping with her arm over her head which helped relieve some of the pain along the right scapula.  She has had no Neri symptoms, bilateral CVA is negative.  No abdominal pain, cough, fever, URI symptoms.  Denies any fever.  Does report the pain is worse with activity and twisting.  Chest x-ray today showed no consolidation, pneumothorax, pleural effusion.  She is neurologically intact with bilateral upper extremity strength 5 out  of 5.  She is overall well-appearing, vitals are within normal limits.  Suspect likely muscle strain, will provide patient with a short prescription of muscle relaxers along with rice therapy to help with her symptoms.  She will also be provided with a work note in order to return back to work in a few days.  Patient understands and agrees to management, return precautions discussed at length.    Portions of this note were generated with [employer]. Dictation errors may occur despite best attempts at proofreading.  Final Clinical Impression(s) / ED Diagnoses Final diagnoses:  Muscle strain    Rx / DC Orders ED Discharge Orders         Ordered    methocarbamol (ROBAXIN) 500 MG tablet  2 times daily     12/25/19 1031           Janeece Fitting, PA-C 12/25/19 1034    Quintella Reichert, MD 12/26/19 [phone]

## 2019-12-25 NOTE — ED Triage Notes (Signed)
C/o rt chest and lower rt back pain onset yesterday  Increased w movement and inspiration

## 2020-07-30 ENCOUNTER — Ambulatory Visit (INDEPENDENT_AMBULATORY_CARE_PROVIDER_SITE_OTHER): Payer: Medicaid Other

## 2020-07-30 ENCOUNTER — Other Ambulatory Visit: Payer: Self-pay | Admitting: Podiatry

## 2020-07-30 ENCOUNTER — Ambulatory Visit: Payer: Medicaid Other | Admitting: Podiatry

## 2020-07-30 ENCOUNTER — Encounter: Payer: Self-pay | Admitting: Podiatry

## 2020-07-30 ENCOUNTER — Other Ambulatory Visit: Payer: Self-pay

## 2020-07-30 VITALS — Temp 97.0°F

## 2020-07-30 DIAGNOSIS — M79672 Pain in left foot: Secondary | ICD-10-CM

## 2020-07-30 DIAGNOSIS — M722 Plantar fascial fibromatosis: Secondary | ICD-10-CM

## 2020-07-30 MED ORDER — METHYLPREDNISOLONE 4 MG PO TBPK: ORAL_TABLET | ORAL | 0 refills | Status: DC

## 2020-07-30 MED ORDER — MELOXICAM 15 MG PO TABS: 15.0000 mg | ORAL_TABLET | Freq: Every day | ORAL | 3 refills | Status: DC

## 2020-07-30 NOTE — Progress Notes (Signed)
Subjective:  Patient ID: Tiffany [name], female    DOB: [birth year],  MRN: [id number] HPI Chief Complaint  Patient presents with  . Foot Pain    Bilateral; heel-lateral side; midfoot (towards medial side of arch); pt stated, "Pain is on and off; have soaked feet in epsom salt and that has helped with pain"; x2 months    29 y.o. female presents with the above complaint.   ROS: Denies fever chills nausea vomiting muscle aches pains calf pain back pain chest pain shortness of breath.  Past Medical History:  Diagnosis Date  . Anemia    hx of  . Anxiety    not on any meds  . Asthma    Albuterol inhaler prn   . History of migraine    last one a week ago  . Insomnia    doesn't take any meds  . Shortness of breath dyspnea    with exertion or laying straight back   Past Surgical History:  Procedure Laterality Date  . FOOT SURGERY    . LIPOMA EXCISION Left 03/14/2015   Procedure: EXCISION OF LEFT ANTECUBITAL PHLEBOLITH;  Surgeon: Manus Rudd, MD;  Location: [facility] OR;  Service: General;  Laterality: Left;  . WISDOM TOOTH EXTRACTION  2009    Current Outpatient Medications:  .  albuterol (VENTOLIN HFA) 108 (90 Base) MCG/ACT inhaler, Inhale 1-2 puffs into the lungs every 6 (six) hours as needed for wheezing or shortness of breath., Disp: 6.7 g, Rfl: 0 .  cetirizine (ZYRTEC) 10 MG tablet, Take 10 mg by mouth daily., Disp: , Rfl:  .  naproxen (NAPROSYN) 500 MG tablet, Take 500 mg by mouth 2 (two) times daily as needed., Disp: , Rfl:  .  meloxicam (MOBIC) 15 MG tablet, Take 1 tablet (15 mg total) by mouth daily., Disp: 30 tablet, Rfl: 3 .  methylPREDNISolone (MEDROL DOSEPAK) 4 MG TBPK tablet, 6 day dose pack - take as directed, Disp: 21 tablet, Rfl: 0  No Known Allergies Review of Systems Objective:   Vitals:   07/30/20 0942  Temp: (!) 97 F (36.1 C)    General: Well developed, nourished, in no acute distress, alert and oriented x3   Dermatological: Skin is warm, dry and supple  bilateral. Nails x 10 are well maintained; remaining integument appears unremarkable at this time. There are no open sores, no preulcerative lesions, no rash or signs of infection present.  Vascular: Dorsalis Pedis artery and Posterior Tibial artery pedal pulses are 2/4 bilateral with immedate capillary fill time. Pedal hair growth present. No varicosities and no lower extremity edema present bilateral.   Neruologic: Grossly intact via light touch bilateral. Vibratory intact via tuning fork bilateral. Protective threshold with Semmes Wienstein monofilament intact to all pedal sites bilateral. Patellar and Achilles deep tendon reflexes 2+ bilateral. No Babinski or clonus noted bilateral.   Musculoskeletal: No gross boney pedal deformities bilateral. No pain, crepitus, or limitation noted with foot and ankle range of motion bilateral. Muscular strength 5/5 in all groups tested bilateral.  Pain on palpation medial longitudinal arch and particularly at the point of attachment of the plantar fascia at the calcaneus.  No pain on medial lateral compression of the calcaneus left seems to be worse than right.  Pes planus is noted bilaterally.  Gait: Unassisted, Nonantalgic.    Radiographs:  Radiographs taken today demonstrate osseously mature individual soft tissue increase in density five retrocalcaneal surgical site to repeat retained pins to the first metatarsal of the right foot mild bunion  deformity left foot.  Assessment & Plan:   Assessment: Plantar fasciitis left greater than right  Plan: Discussed etiology pathology conservative surgical therapies injected her left heel today 20 mg Kenalog 5 mg Marcaine point maximal tenderness.  Placed in a plantar fascial brace and a night splint.  Discussed appropriate shoe gear stretching exercises ice therapy sugar modifications.  Start her on a Medrol Dosepak to be followed by meloxicam.  I will follow-up with her in 1 week.     Jayon Matton T. [place], [place]

## 2020-09-10 ENCOUNTER — Ambulatory Visit: Payer: Medicaid Other | Admitting: Podiatry

## 2020-10-03 ENCOUNTER — Ambulatory Visit: Payer: Medicaid Other | Admitting: Podiatry

## 2020-10-10 ENCOUNTER — Other Ambulatory Visit: Payer: Self-pay

## 2020-10-10 ENCOUNTER — Encounter: Payer: Self-pay | Admitting: Podiatry

## 2020-10-10 ENCOUNTER — Ambulatory Visit (INDEPENDENT_AMBULATORY_CARE_PROVIDER_SITE_OTHER): Payer: Medicaid Other | Admitting: Podiatry

## 2020-10-10 DIAGNOSIS — M722 Plantar fascial fibromatosis: Secondary | ICD-10-CM | POA: Diagnosis not present

## 2020-10-10 MED ORDER — METHYLPREDNISOLONE 4 MG PO TBPK: ORAL_TABLET | ORAL | 0 refills | Status: AC

## 2020-10-10 MED ORDER — MELOXICAM 15 MG PO TABS: 15.0000 mg | ORAL_TABLET | Freq: Every day | ORAL | 3 refills | Status: DC

## 2020-10-12 NOTE — Progress Notes (Signed)
She presents today states that her feet felt better for a while but they are sore again.  As she refers to the plantar fasciitis bilaterally.  She states that she did not get her meloxicam at the pharmacist says she has not been taking that since her last visit..  States that her feet still feel better than they did but have regressed.  Objective: Vital signs are stable she is alert and oriented x3.  Pulses are palpable.  Still has pain on palpation medial calcaneal tubercles bilateral.  Assessment: Plantar fasciitis some better.  Plan: At this point we started over with the methylprednisolone and the meloxicam.  I will follow-up with her in 6 weeks we did discuss getting some over-the-counter inserts.

## 2020-11-21 ENCOUNTER — Ambulatory Visit: Payer: Medicaid Other | Admitting: Podiatry

## 2020-12-03 ENCOUNTER — Ambulatory Visit: Payer: Medicaid Other | Admitting: Podiatry

## 2021-01-28 ENCOUNTER — Encounter (HOSPITAL_BASED_OUTPATIENT_CLINIC_OR_DEPARTMENT_OTHER): Payer: Self-pay | Admitting: Emergency Medicine

## 2021-01-28 ENCOUNTER — Emergency Department (HOSPITAL_BASED_OUTPATIENT_CLINIC_OR_DEPARTMENT_OTHER)
Admission: EM | Admit: 2021-01-28 | Discharge: 2021-01-28 | Disposition: A | Discharge status: Home / Self Care | Payer: Worker's Compensation | Attending: Emergency Medicine | Admitting: Emergency Medicine

## 2021-01-28 ENCOUNTER — Other Ambulatory Visit: Payer: Self-pay

## 2021-01-28 DIAGNOSIS — F17290 Nicotine dependence, other tobacco product, uncomplicated: Secondary | ICD-10-CM | POA: Diagnosis not present

## 2021-01-28 DIAGNOSIS — Y93H2 Activity, gardening and landscaping: Secondary | ICD-10-CM | POA: Insufficient documentation

## 2021-01-28 DIAGNOSIS — J45909 Unspecified asthma, uncomplicated: Secondary | ICD-10-CM | POA: Diagnosis not present

## 2021-01-28 DIAGNOSIS — S301XXA Contusion of abdominal wall, initial encounter: Secondary | ICD-10-CM | POA: Diagnosis not present

## 2021-01-28 DIAGNOSIS — S3991XA Unspecified injury of abdomen, initial encounter: Secondary | ICD-10-CM | POA: Diagnosis present

## 2021-01-28 DIAGNOSIS — Y990 Civilian activity done for income or pay: Secondary | ICD-10-CM | POA: Diagnosis not present

## 2021-01-28 DIAGNOSIS — W208XXA Other cause of strike by thrown, projected or falling object, initial encounter: Secondary | ICD-10-CM | POA: Diagnosis not present

## 2021-01-28 DIAGNOSIS — R109 Unspecified abdominal pain: Secondary | ICD-10-CM

## 2021-01-28 NOTE — Discharge Instructions (Signed)
You were evaluated in the emergency department today for your abdominal pain after your injury at work.  Your physical exam and vital signs were very reassuring.  There is no indication for imaging of your abdomen in the emergency department at this time.  Pay close attention to your symptoms, if you develop any severely worsening abdominal pain, nausea or vomiting that does not stop, blood in your urine, or any other new severe symptoms please return to the emergency department immediately.  You may take Tylenol ibuprofen as needed for your pain.  Apply ice to the area 1520 minutes at a time for the next few days.  You may follow-up with your primary care doctor as well.

## 2021-01-28 NOTE — ED Notes (Signed)
EUDS performed on PT. Time OTF 1hr+. Robin EMT assisted with paperwork.

## 2021-01-28 NOTE — ED Provider Notes (Signed)
[facility] EMERGENCY DEPARTMENT Provider Note   CSN: [id number] Arrival date & time: 01/28/21  2022     History Chief Complaint  Patient presents with  . Abdominal Pain    Tiffany [name] is a 30 y.o. female who presents at the prompting of her employer for evaluation of abdominal pain.  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  She endorses some soreness with movement since that time and mild bruising but denies any nausea, vomiting, hematuria, or severe abdominal pain when at rest.  She is presenting primarily prompting of her employer.  I personally read this patient's medical records.  History of asthma and migraines, but she is not on any medications every day.  HPI     Past Medical History:  Diagnosis Date  . Anemia    hx of  . Anxiety    not on any meds  . Asthma    Albuterol inhaler prn   . History of migraine    last one a week ago  . Insomnia    doesn't take any meds  . Shortness of breath dyspnea    with exertion or laying straight back    Patient Active Problem List   Diagnosis Date Noted  . Mild preeclampsia delivered 03/09/2013  . Vaginal delivery 03/08/2013  . Active labor 03/07/2013  . Anemia 03/07/2013  . 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 Cigarette smoker 02/10/2013  . Pregnant state, incidental 02/10/2013    Past Surgical History:  Procedure Laterality Date  . FOOT SURGERY    . LIPOMA EXCISION Left 03/14/2015   Procedure: EXCISION OF LEFT ANTECUBITAL PHLEBOLITH;  Surgeon: Manus Rudd, MD;  Location: [facility] OR;  Service: General;  Laterality: Left;  . WISDOM TOOTH EXTRACTION  2009     OB History    Gravida  1   Para  1   Term  1   Preterm  0   AB  0   Living  1     SAB  0   IAB  0   Ectopic  0   Multiple  0   Live Births  1           Family History   Problem Relation Age of Onset  . Asthma Other   . Asthma Other   . Asthma Sister   . Asthma Brother   . Other Neg Hx   . Alcohol abuse Neg Hx   . Arthritis Neg Hx   . Birth defects Neg Hx   . Cancer Neg Hx   . COPD Neg Hx   . Depression Neg Hx   . Diabetes Neg Hx   . Drug abuse Neg Hx   . Early death Neg Hx   . Hearing loss Neg Hx   . Heart disease Neg Hx   . Hyperlipidemia Neg Hx   . Hypertension Neg Hx   . Kidney disease Neg Hx   . Learning disabilities Neg Hx   . Mental illness Neg Hx   . Mental retardation Neg Hx   . Miscarriages / Stillbirths Neg Hx   . Stroke Neg Hx   . Vision loss Neg Hx     Social History   Tobacco Use  . Smoking status: Current Every Day Smoker    Packs/day: 0.10    Years:  4.00    Pack years: 0.40    Types: Cigars  . Smokeless tobacco: Never Used  . Tobacco comment: 2 cigs/day  Vaping Use  . Vaping Use: Never used  Substance Use Topics  . Alcohol use: No  . Drug use: Yes    Types: Marijuana    Home Medications Prior to Admission medications   Medication Sig Start Date End Date Taking? Authorizing Provider  albuterol (VENTOLIN HFA) 108 (90 Base) MCG/ACT inhaler Inhale 1-2 puffs into the lungs every 6 (six) hours as needed for wheezing or shortness of breath. 09/25/19   Couture, Cortni S, PA-C  cetirizine (ZYRTEC) 10 MG tablet Take 10 mg by mouth daily. 07/10/20   [provider]  meloxicam (MOBIC) 15 MG tablet Take 1 tablet (15 mg total) by mouth daily. 10/10/20   Hyatt, Max T, DPM  methylPREDNISolone (MEDROL DOSEPAK) 4 MG TBPK tablet 6 day dose pack - take as directed 10/10/20   Hyatt, Max T, DPM  naproxen (NAPROSYN) 500 MG tablet Take 500 mg by mouth 2 (two) times daily as needed. 07/11/20   [provider]    Allergies    Patient has no known allergies.  Review of Systems   Review of Systems  Constitutional: Negative.   HENT: Negative.   Eyes: Negative.   Respiratory: Negative.  Negative for shortness of  breath.   Cardiovascular: Negative.  Negative for chest pain.  Gastrointestinal: Positive for abdominal pain. Negative for blood in stool, nausea and vomiting.  Genitourinary: Negative.  Negative for hematuria.  Musculoskeletal: Negative.   Skin: Negative.   Neurological: Negative.     Physical Exam Updated Vital Signs BP (!) 123/97   Pulse (!) 104   Temp 98.2 F (36.8 C) (Oral)   Ht 5\' 4"  (1.626 m)   Wt 71.7 kg   LMP  (LMP Unknown) Comment: Depo shot  SpO2 100%   BMI 27.13 kg/m   Physical Exam Vitals and nursing note reviewed.  HENT:     Head: Normocephalic and atraumatic.     Mouth/Throat:     Mouth: Mucous membranes are moist.     Pharynx: No oropharyngeal exudate or posterior oropharyngeal erythema.  Eyes:     General:        Right eye: No discharge.        Left eye: No discharge.     Extraocular Movements: Extraocular movements intact.     Conjunctiva/sclera: Conjunctivae normal.     Pupils: Pupils are equal, round, and reactive to light.  Cardiovascular:     Rate and Rhythm: Normal rate and regular rhythm.     Pulses: Normal pulses.     Heart sounds: Normal heart sounds. No murmur heard.   Pulmonary:     Effort: Pulmonary effort is normal. No respiratory distress.     Breath sounds: Normal breath sounds. No wheezing or rales.  Chest:     Chest wall: No lacerations, deformity, swelling, tenderness, crepitus or edema.  Abdominal:     General: Bowel sounds are normal. There is no distension.     Palpations: Abdomen is soft.     Tenderness: There is abdominal tenderness. There is no guarding or rebound. Negative signs include Murphy's sign and Rovsing's sign.    Musculoskeletal:        General: No deformity.     Cervical back: Neck supple. No crepitus. No pain with movement or spinous process tenderness.     Right lower leg: No edema.  Left lower leg: No edema.  Skin:    General: Skin is warm and dry.     Capillary Refill: Capillary refill takes less  than 2 seconds.  Neurological:     General: No focal deficit present.     Mental Status: She is alert and oriented to person, place, and time. Mental status is at baseline.  Psychiatric:        Mood and Affect: Mood normal.     ED Results / Procedures / Treatments   Labs (all labs ordered are listed, but only abnormal results are displayed) Labs Reviewed - No data to display  EKG None  Radiology No results found.  Procedures Procedures   Medications Ordered in ED Medications - No data to display  ED Course  I have reviewed the triage vital signs and the nursing notes.  Pertinent labs & imaging results that were available during my care of the patient were reviewed by me and considered in my medical decision making (see chart for details).    MDM Rules/Calculators/A&P                         31 year old female presents concern for right lower abdominal pain after being struck with a piece of wood released from a piece of machinery at her work this evening.  Mildly tachycardic on intake, vital signs otherwise normal.  Cardiopulmonary exam is normal at time of my exam, abdominal exam with area of bruising with the patient was struck with some superficial tenderness to palpation, without deep tenderness to palpation, guarding, rebound, or hemodynamic instability.  Abdomen is soft and nondistended.   Given reassuring exam and hemodynamic stability approximately 3 hours after initial injury, I do not feel CT scan is warranted at this time.  Case discussed with attending physician who agrees with treatment plan for watchful waiting in the outpatient setting.  Treatment plan discussed with Denaly, was agreeable to paying close attention to her symptoms outpatient.  Return precautions given, should the patient develop worsening abdominal pain, nausea, vomiting, hematuria, or any other new severe symptoms.    Given reassuring vital signs and physical exam, no further work-up is  warranted in the ED at this time.  The patient voiced understanding of her medical evaluation and treatment plan.  Each of her questions was answered to her expressed satisfaction.  Return precautions given.  Patient is stable and appropriate for discharge at this time.  This chart was dictated using voice recognition software, Dragon. Despite the best efforts of this provider to proofread and correct errors, errors may still occur which can change documentation meaning.  Final Clinical Impression(s) / ED Diagnoses Final diagnoses:  None    Rx / DC Orders ED Discharge Orders    None       Sherrilee Gilles 01/29/21 0051    Maia Plan, MD 02/06/21 [phone]

## 2021-01-28 NOTE — ED Triage Notes (Signed)
Pt c/o RLQ pain from being hit with a piece of wood off a machine while at work. Pt was sent from work for evaluation. Pt denies any bleeding, SOB, or open wounds. Pt states that the area is sore when she moves but denies any other pain. Pt aaox3, ambulatory with steady gait, VSS, GCS 15, NAD noted.

## 2021-02-06 ENCOUNTER — Emergency Department (HOSPITAL_BASED_OUTPATIENT_CLINIC_OR_DEPARTMENT_OTHER): Payer: Medicaid Other

## 2021-02-06 ENCOUNTER — Encounter (HOSPITAL_BASED_OUTPATIENT_CLINIC_OR_DEPARTMENT_OTHER): Payer: Self-pay | Admitting: Emergency Medicine

## 2021-02-06 ENCOUNTER — Emergency Department (HOSPITAL_BASED_OUTPATIENT_CLINIC_OR_DEPARTMENT_OTHER)
Admission: EM | Admit: 2021-02-06 | Discharge: 2021-02-06 | Disposition: A | Discharge status: Home / Self Care | Payer: Medicaid Other | Attending: Emergency Medicine | Admitting: Emergency Medicine

## 2021-02-06 ENCOUNTER — Other Ambulatory Visit: Payer: Self-pay

## 2021-02-06 DIAGNOSIS — F17290 Nicotine dependence, other tobacco product, uncomplicated: Secondary | ICD-10-CM | POA: Insufficient documentation

## 2021-02-06 DIAGNOSIS — N949 Unspecified condition associated with female genital organs and menstrual cycle: Secondary | ICD-10-CM

## 2021-02-06 DIAGNOSIS — N838 Other noninflammatory disorders of ovary, fallopian tube and broad ligament: Secondary | ICD-10-CM | POA: Diagnosis not present

## 2021-02-06 DIAGNOSIS — Z79899 Other long term (current) drug therapy: Secondary | ICD-10-CM | POA: Insufficient documentation

## 2021-02-06 DIAGNOSIS — K7689 Other specified diseases of liver: Secondary | ICD-10-CM | POA: Diagnosis not present

## 2021-02-06 DIAGNOSIS — R319 Hematuria, unspecified: Secondary | ICD-10-CM | POA: Diagnosis not present

## 2021-02-06 DIAGNOSIS — J45909 Unspecified asthma, uncomplicated: Secondary | ICD-10-CM | POA: Insufficient documentation

## 2021-02-06 DIAGNOSIS — R109 Unspecified abdominal pain: Secondary | ICD-10-CM

## 2021-02-06 DIAGNOSIS — R1031 Right lower quadrant pain: Secondary | ICD-10-CM | POA: Diagnosis present

## 2021-02-06 LAB — CBC WITH DIFFERENTIAL/PLATELET
Abs Immature Granulocytes: 0.04 10*3/uL (ref 0.00–0.07)
Basophils Absolute: 0 10*3/uL (ref 0.0–0.1)
Basophils Relative: 0 %
Eosinophils Absolute: 0 10*3/uL (ref 0.0–0.5)
Eosinophils Relative: 0 %
HCT: 39.2 % (ref 36.0–46.0)
Hemoglobin: 13.7 g/dL (ref 12.0–15.0)
Immature Granulocytes: 0 %
Lymphocytes Relative: 28 %
Lymphs Abs: 3.1 10*3/uL (ref 0.7–4.0)
MCH: 33.7 pg (ref 26.0–34.0)
MCHC: 34.9 g/dL (ref 30.0–36.0)
MCV: 96.6 fL (ref 80.0–100.0)
Monocytes Absolute: 0.7 10*3/uL (ref 0.1–1.0)
Monocytes Relative: 6 %
Neutro Abs: 7.1 10*3/uL (ref 1.7–7.7)
Neutrophils Relative %: 66 %
Platelets: 247 10*3/uL (ref 150–400)
RBC: 4.06 MIL/uL (ref 3.87–5.11)
RDW: 12.9 % (ref 11.5–15.5)
WBC: 11 10*3/uL — ABNORMAL HIGH (ref 4.0–10.5)
nRBC: 0 % (ref 0.0–0.2)

## 2021-02-06 LAB — COMPREHENSIVE METABOLIC PANEL
ALT: 13 U/L (ref 0–44)
AST: 18 U/L (ref 15–41)
Albumin: 4.3 g/dL (ref 3.5–5.0)
Alkaline Phosphatase: 37 U/L — ABNORMAL LOW (ref 38–126)
Anion gap: 12 (ref 5–15)
BUN: 7 mg/dL (ref 6–20)
CO2: 23 mmol/L (ref 22–32)
Calcium: 9.3 mg/dL (ref 8.9–10.3)
Chloride: 104 mmol/L (ref 98–111)
Creatinine, Ser: 0.71 mg/dL (ref 0.44–1.00)
GFR, Estimated: >60 mL/min (ref >60)
Glucose, Bld: 106 mg/dL — ABNORMAL HIGH (ref 70–99)
Potassium: 3.6 mmol/L (ref 3.5–5.1)
Sodium: 139 mmol/L (ref 135–145)
Total Bilirubin: 0.5 mg/dL (ref 0.3–1.2)
Total Protein: 7.6 g/dL (ref 6.5–8.1)

## 2021-02-06 LAB — URINALYSIS, MICROSCOPIC (REFLEX)

## 2021-02-06 LAB — URINALYSIS, ROUTINE W REFLEX MICROSCOPIC
Bilirubin Urine: NEGATIVE
Glucose, UA: NEGATIVE mg/dL
Ketones, ur: NEGATIVE mg/dL
Leukocytes,Ua: NEGATIVE
Nitrite: NEGATIVE
Protein, ur: NEGATIVE mg/dL
Specific Gravity, Urine: <1.005 — ABNORMAL LOW (ref 1.005–1.030)
pH: 6.5 (ref 5.0–8.0)

## 2021-02-06 LAB — PREGNANCY, URINE: Preg Test, Ur: NEGATIVE

## 2021-02-06 MED ORDER — IOHEXOL 300 MG/ML  SOLN
100.0000 mL | Freq: Once | INTRAMUSCULAR | Status: AC | PRN
  Administered 2021-02-06: 100 mL via INTRAVENOUS

## 2021-02-06 MED ORDER — LIDOCAINE 5 % EX PTCH
1.0000 | MEDICATED_PATCH | CUTANEOUS | Status: DC
  Administered 2021-02-06: 1 via TRANSDERMAL
  Filled 2021-02-06: qty 1

## 2021-02-06 MED ORDER — LIDOCAINE 4 % EX PTCH: 1.0000 | MEDICATED_PATCH | Freq: Two times a day (BID) | CUTANEOUS | 0 refills | Status: DC | PRN

## 2021-02-06 MED ORDER — NAPROXEN 500 MG PO TABS: 500.0000 mg | ORAL_TABLET | Freq: Two times a day (BID) | ORAL | 0 refills | Status: DC

## 2021-02-06 MED ORDER — SODIUM CHLORIDE 0.9 % IV BOLUS
1000.0000 mL | Freq: Once | INTRAVENOUS | Status: AC
  Administered 2021-02-06: 1000 mL via INTRAVENOUS

## 2021-02-06 MED ORDER — KETOROLAC TROMETHAMINE 15 MG/ML IJ SOLN
15.0000 mg | Freq: Once | INTRAMUSCULAR | Status: AC
  Administered 2021-02-06: 15 mg via INTRAVENOUS
  Filled 2021-02-06: qty 1
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
## 2021-02-06 NOTE — Discharge Instructions (Signed)
You were seen at the emergency department for abdominal pain. Please pick up your prescriptions at your pharmacy. Be sure to talk with your PCP and gynecologist about blood in your urine.   Take Care,   Dr. Rachael Darby  [facility] Emergency Department

## 2021-02-06 NOTE — ED Triage Notes (Signed)
Pt was seen here on 3/1 for an injury to her rt side  , was told to come back to er  If  she has blood in her urine  [facility] test at [facility] and needs a recheck

## 2021-03-18 ENCOUNTER — Encounter (HOSPITAL_BASED_OUTPATIENT_CLINIC_OR_DEPARTMENT_OTHER): Payer: Self-pay | Admitting: Emergency Medicine

## 2021-03-18 ENCOUNTER — Emergency Department (HOSPITAL_BASED_OUTPATIENT_CLINIC_OR_DEPARTMENT_OTHER)
Admission: EM | Admit: 2021-03-18 | Discharge: 2021-03-18 | Disposition: A | Discharge status: Home / Self Care | Payer: Medicaid Other | Attending: Emergency Medicine | Admitting: Emergency Medicine

## 2021-03-18 ENCOUNTER — Other Ambulatory Visit: Payer: Self-pay

## 2021-03-18 DIAGNOSIS — F17290 Nicotine dependence, other tobacco product, uncomplicated: Secondary | ICD-10-CM | POA: Insufficient documentation

## 2021-03-18 DIAGNOSIS — J45909 Unspecified asthma, uncomplicated: Secondary | ICD-10-CM | POA: Diagnosis not present

## 2021-03-18 DIAGNOSIS — L02511 Cutaneous abscess of right hand: Secondary | ICD-10-CM | POA: Insufficient documentation

## 2021-03-18 DIAGNOSIS — Z189 Retained foreign body fragments, unspecified material: Secondary | ICD-10-CM | POA: Diagnosis not present

## 2021-03-18 DIAGNOSIS — L923 Foreign body granuloma of the skin and subcutaneous tissue: Secondary | ICD-10-CM | POA: Diagnosis not present

## 2021-03-18 MED ORDER — NAPROXEN 500 MG PO TABS: 500.0000 mg | ORAL_TABLET | Freq: Two times a day (BID) | ORAL | 0 refills | Status: DC

## 2021-03-18 MED ORDER — LIDOCAINE-EPINEPHRINE (PF) 2 %-1:200000 IJ SOLN
20.0000 mL | Freq: Once | INTRAMUSCULAR | Status: AC
  Administered 2021-03-18: 20 mL via INTRADERMAL
  Filled 2021-03-18: qty 20

## 2021-03-18 NOTE — ED Provider Notes (Signed)
[facility] Provider Note: Lowella Dell, MD, FACEP  CSN: [id number] MRN: [id number] ARRIVAL: 03/18/21 at 0237 ROOM: MH12/[facility]12   CHIEF COMPLAINT  Wound Check   HISTORY OF PRESENT ILLNESS  03/18/21 3:15 AM Tiffany [name] is a 30 y.o. female who got a "splinter" or some other foreign object embedded in the palm of her right hand at work about a month ago.  A friend tried to remove the foreign object at that time but was unsuccessful.  She has subsequently developed a thickened, hyperpigmented maculopapular lesion at the site.  Over the past 3 days she has developed a fluctuant, purulent pocket adjacent to the lesion which has become painful.  She rates her pain as a 9 out of 10, worse with movement or palpation.  She still has proper function and sensation in the hand.   Past Medical History:  Diagnosis Date  . Anemia    hx of  . Anxiety    not on any meds  . Asthma    Albuterol inhaler prn   . History of migraine    last one a week ago  . Insomnia    doesn't take any meds  . Shortness of breath dyspnea    with exertion or laying straight back    Past Surgical History:  Procedure Laterality Date  . FOOT SURGERY    . LIPOMA EXCISION Left 03/14/2015   Procedure: EXCISION OF LEFT ANTECUBITAL PHLEBOLITH;  Surgeon: Manus Rudd, MD;  Location: [facility] OR;  Service: General;  Laterality: Left;  . WISDOM TOOTH EXTRACTION  2009    Family History  Problem Relation Age of Onset  . Asthma Other   . Asthma Other   . Asthma Sister   . Asthma Brother   . Other Neg Hx   . Alcohol abuse Neg Hx   . Arthritis Neg Hx   . Birth defects Neg Hx   . Cancer Neg Hx   . COPD Neg Hx   . Depression Neg Hx   . Diabetes Neg Hx   . Drug abuse Neg Hx   . Early death Neg Hx   . Hearing loss Neg Hx   . Heart disease Neg Hx   . Hyperlipidemia Neg Hx   . Hypertension Neg Hx   . Kidney disease Neg Hx   . Learning disabilities Neg Hx   . Mental illness Neg Hx   . Mental retardation  Neg Hx   . Miscarriages / Stillbirths Neg Hx   . Stroke Neg Hx   . Vision loss Neg Hx     Social History   Tobacco Use  . Smoking status: Current Every Day Smoker    Packs/day: 0.10    Years: 4.00    Pack years: 0.40    Types: Cigars  . Smokeless tobacco: Never Used  . Tobacco comment: 2 cigs/day  Vaping Use  . Vaping Use: Never used  Substance Use Topics  . Alcohol use: No  . Drug use: Yes    Types: Marijuana    Prior to Admission medications   Medication Sig Start Date End Date Taking? Authorizing Provider  albuterol (VENTOLIN HFA) 108 (90 Base) MCG/ACT inhaler Inhale 1-2 puffs into the lungs every 6 (six) hours as needed for wheezing or shortness of breath. 09/25/19   Couture, Cortni S, PA-C  cetirizine (ZYRTEC) 10 MG tablet Take 10 mg by mouth daily. 07/10/20   [provider]  Lidocaine (HM LIDOCAINE PATCH) 4 % PTCH Apply  1 each topically every 12 (twelve) hours as needed. 02/06/21   Brimage, Seward Meth, DO  methylPREDNISolone (MEDROL DOSEPAK) 4 MG TBPK tablet 6 day dose pack - take as directed 10/10/20   Hyatt, Max T, DPM  naproxen (NAPROSYN) 500 MG tablet Take 1 tablet (500 mg total) by mouth 2 (two) times daily with a meal. 02/06/21   Brimage, Seward Meth, DO    Allergies Patient has no known allergies.   REVIEW OF SYSTEMS  Negative except as noted here or in the History of Present Illness.   PHYSICAL EXAMINATION  Initial Vital Signs Blood pressure (!) 133/96, pulse 77, temperature 98.3 F (36.8 C), temperature source Oral, resp. rate 16, height 5\' 4"  (1.626 m), weight 71.7 kg, SpO2 100 %.  Examination General: Well-developed, well-nourished female in no acute distress; appearance consistent with age of record HENT: normocephalic; atraumatic Eyes: Normal appearance Neck: supple Heart: regular rate and rhythm Lungs: clear to auscultation bilaterally Abdomen: soft; nondistended; nontender; bowel sounds present Extremities: No deformity; full range of  motion Neurologic: Awake, alert and oriented; motor function intact in all extremities and symmetric; no facial droop Skin: Warm and dry; thickened, hyperpigmented macular papular lesion of right palm with adjacent fluctuant abscess:    Psychiatric: Anxious   RESULTS  Summary of this visit's results, reviewed and interpreted by myself:   EKG Interpretation  Date/Time:    Ventricular Rate:    PR Interval:    QRS Duration:   QT Interval:    QTC Calculation:   R Axis:     Text Interpretation:        Laboratory Studies: No results found for this or any previous visit (from the past 24 hour(s)). Imaging Studies: No results found.  ED COURSE and MDM  Nursing notes, initial and subsequent vitals signs, including pulse oximetry, reviewed and interpreted by myself.  Vitals:   03/18/21 0245 03/18/21 0248  BP: (!) 133/96   Pulse: 77   Resp: 16   Temp: 98.3 F (36.8 C)   TempSrc: Oral   SpO2: 100%   Weight:  71.7 kg  Height:  5\' 4"  (1.626 m)   Medications  lidocaine-EPINEPHrine (XYLOCAINE W/EPI) 2 %-1:200000 (PF) injection 20 mL (20 mLs Intradermal Given by Other 03/18/21 0321)    Excised hyperpigmented, thickened possibly devitalized skin with suspected foreign object sent for histopathological analysis.  Wound left open to heal by secondary intention.  PROCEDURES  Procedures INCISION AND DRAINAGE Performed by: Mouhamed Glassco Consent: Verbal consent obtained. Risks and benefits: risks, benefits and alternatives were discussed Type: abscess  Body area: Right palm  Anesthesia: local infiltration  Incision was made with a scalpel.  Pus was drained and the thickened, hyperpigmented macule was debrided.  Local anesthetic: lidocaine 2% with epinephrine  Anesthetic total: 4 ml  Complexity: complex Blunt dissection to break up loculations  Drainage: purulent  Drainage amount: Copious  Packing material: None  Patient tolerance: Patient tolerated the procedure  well with no immediate complications.    ED DIAGNOSES     ICD-10-CM   1. Abscess of right hand  L02.511   2. Foreign body granuloma of skin  L92.3        Norene Oliveri, 03/20/21, MD 03/18/21 [phone]

## 2021-03-18 NOTE — ED Triage Notes (Signed)
Pt has wound on right palm, possible retained splinter.

## 2021-03-24 LAB — SURGICAL PATHOLOGY

## 2021-04-21 ENCOUNTER — Other Ambulatory Visit: Payer: Medicaid Other

## 2022-01-23 ENCOUNTER — Emergency Department (HOSPITAL_COMMUNITY): Payer: Medicaid Other

## 2022-01-23 ENCOUNTER — Encounter (HOSPITAL_COMMUNITY): Payer: Self-pay | Admitting: Emergency Medicine

## 2022-01-23 ENCOUNTER — Emergency Department (HOSPITAL_COMMUNITY)
Admission: EM | Admit: 2022-01-23 | Discharge: 2022-01-23 | Disposition: A | Discharge status: Home / Self Care | Payer: Medicaid Other | Attending: Emergency Medicine | Admitting: Emergency Medicine

## 2022-01-23 ENCOUNTER — Other Ambulatory Visit: Payer: Self-pay

## 2022-01-23 DIAGNOSIS — S61309A Unspecified open wound of unspecified finger with damage to nail, initial encounter: Secondary | ICD-10-CM

## 2022-01-23 DIAGNOSIS — Z23 Encounter for immunization: Secondary | ICD-10-CM | POA: Diagnosis not present

## 2022-01-23 DIAGNOSIS — S61101A Unspecified open wound of right thumb with damage to nail, initial encounter: Secondary | ICD-10-CM | POA: Insufficient documentation

## 2022-01-23 DIAGNOSIS — S6991XA Unspecified injury of right wrist, hand and finger(s), initial encounter: Secondary | ICD-10-CM

## 2022-01-23 DIAGNOSIS — J45909 Unspecified asthma, uncomplicated: Secondary | ICD-10-CM | POA: Insufficient documentation

## 2022-01-23 MED ORDER — KETOROLAC TROMETHAMINE 60 MG/2ML IM SOLN
60.0000 mg | Freq: Once | INTRAMUSCULAR | Status: AC
  Administered 2022-01-23: 60 mg via INTRAMUSCULAR
  Filled 2022-01-23: qty 2

## 2022-01-23 MED ORDER — AMOXICILLIN-POT CLAVULANATE 875-125 MG PO TABS
1.0000 | ORAL_TABLET | Freq: Once | ORAL | Status: AC
  Administered 2022-01-23: 1 via ORAL
  Filled 2022-01-23: qty 1

## 2022-01-23 MED ORDER — TETANUS-DIPHTH-ACELL PERTUSSIS 5-2.5-18.5 LF-MCG/0.5 IM SUSY
0.5000 mL | PREFILLED_SYRINGE | Freq: Once | INTRAMUSCULAR | Status: AC
  Administered 2022-01-23: 0.5 mL via INTRAMUSCULAR
  Filled 2022-01-23: qty 0.5

## 2022-01-23 MED ORDER — AMOXICILLIN-POT CLAVULANATE 875-125 MG PO TABS: 1.0000 | ORAL_TABLET | Freq: Two times a day (BID) | ORAL | 0 refills | Status: AC
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
## 2022-01-23 NOTE — ED Notes (Signed)
Ice pack provided to pt at bedside. Pt stated that she does not wish to use it right now. Ice pack remains with pt if she desires to apply it to affected hand.

## 2022-01-23 NOTE — Discharge Instructions (Addendum)
Take augmentin twice daily for the next 7 days.  Call tomorrow to schedule follow-up appointment with Dr. Fredna Dow with hand for next week. Use topical antibiotic ointment over the nailbed after 72 hours you can take the dressing off and evaluate daily for signs of infection.   Your tetanus was updated today, it should be good for 10 years pending no new injuries
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
## 2022-01-27 ENCOUNTER — Ambulatory Visit (HOSPITAL_COMMUNITY)
Admission: EM | Admit: 2022-01-27 | Discharge: 2022-01-27 | Disposition: A | Discharge status: Home / Self Care | Payer: Medicaid Other | Attending: Family Medicine | Admitting: Family Medicine

## 2022-01-27 ENCOUNTER — Encounter (HOSPITAL_COMMUNITY): Payer: Self-pay

## 2022-01-27 DIAGNOSIS — S61101A Unspecified open wound of right thumb with damage to nail, initial encounter: Secondary | ICD-10-CM | POA: Diagnosis not present

## 2022-01-27 DIAGNOSIS — S91209D Unspecified open wound of unspecified toe(s) with damage to nail, subsequent encounter: Secondary | ICD-10-CM

## 2022-01-27 MED ORDER — MUPIROCIN 2 % EX OINT: 1.0000 "application " | TOPICAL_OINTMENT | Freq: Two times a day (BID) | CUTANEOUS | 0 refills | Status: AC

## 2022-01-27 NOTE — ED Triage Notes (Signed)
Pt c/o pain and concerns of rt thumb nail. States broke her nail off and has drainage with odor. States tx'd in ED on Friday for same.

## 2022-01-27 NOTE — Discharge Instructions (Addendum)
Continue taking the amoxicillin-clavulanate till you are finished.  Clean the nailbed 1-2 times daily with either warm soapy water or hydrogen peroxide, then apply a new coating of mupirocin ointment, and apply a new Band-Aid.
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
## 2022-03-30 ENCOUNTER — Ambulatory Visit (HOSPITAL_COMMUNITY)
Admission: EM | Admit: 2022-03-30 | Discharge: 2022-03-30 | Disposition: A | Discharge status: Home / Self Care | Payer: Medicaid Other | Attending: Physician Assistant | Admitting: Physician Assistant

## 2022-03-30 DIAGNOSIS — H1032 Unspecified acute conjunctivitis, left eye: Secondary | ICD-10-CM | POA: Diagnosis not present

## 2022-03-30 MED ORDER — TOBRAMYCIN 0.3 % OP SOLN
1.0000 [drp] | OPHTHALMIC | 0 refills | Status: AC
Start: 2022-03-30 — End: 2022-04-09

## 2022-03-30 NOTE — ED Triage Notes (Signed)
C/o left eye pain and swelling x 2-3 days.  ?

## 2022-03-30 NOTE — ED Provider Notes (Signed)
?[facility] ? ? ? ?CSN: [id number] ?Arrival date & time: 03/30/22  0949 ? ? ?  ? ?History   ?Chief Complaint ?No chief complaint on file. ? ? ?HPI ?Tiffany [name] is a 31 y.o. female.  ? ?The history is provided by the patient. No language interpreter was used.  ?Conjunctivitis ?This is a new problem. The current episode started yesterday. The problem occurs constantly. The problem has been gradually worsening. Pertinent negatives include no shortness of breath. Nothing aggravates the symptoms. Nothing relieves the symptoms. She has tried nothing for the symptoms. The treatment provided no relief.  ? ?Past Medical History:  ?Diagnosis Date  ? Anemia   ? hx of  ? Anxiety   ? not on any meds  ? Asthma   ? Albuterol inhaler prn   ? History of migraine   ? last one a week ago  ? Insomnia   ? doesn't take any meds  ? Shortness of breath dyspnea   ? with exertion or laying straight back  ? ? ?Patient Active Problem List  ? Diagnosis Date Noted  ? Mild preeclampsia delivered 03/09/2013  ? Vaginal delivery 03/08/2013  ? Active labor 03/07/2013  ? Anemia 03/07/2013  ? Drug use complicating pregnancy in second trimester 02/17/2013  ? Cigarette smoker 02/10/2013  ? Pregnant state, incidental 02/10/2013  ? ? ?Past Surgical History:  ?Procedure Laterality Date  ? FOOT SURGERY    ? LIPOMA EXCISION Left 03/14/2015  ? Procedure: EXCISION OF LEFT ANTECUBITAL PHLEBOLITH;  Surgeon: Manus Rudd, MD;  Location: [facility];  Service: General;  Laterality: Left;  ? WISDOM TOOTH EXTRACTION  2009  ? ? ?OB History   ? ? Gravida  ?1  ? Para  ?1  ? Term  ?1  ? Preterm  ?0  ? AB  ?0  ? Living  ?1  ?  ? ? SAB  ?0  ? IAB  ?0  ? Ectopic  ?0  ? Multiple  ?0  ? Live Births  ?1  ?   ?  ?  ? ? ? ?Home Medications   ? ?Prior to Admission medications   ?Medication Sig Start Date End Date Taking? Authorizing Provider  ?tobramycin (TOBREX) 0.3 % ophthalmic solution Place 1 drop into the right eye every 4 (four) hours for 10 days. 03/30/22 04/09/22 Yes  Elson Areas, PA-C  ?albuterol (VENTOLIN HFA) 108 (90 Base) MCG/ACT inhaler Inhale 1-2 puffs into the lungs every 6 (six) hours as needed for wheezing or shortness of breath. 09/25/19   Couture, Cortni S, PA-C  ?amoxicillin-clavulanate (AUGMENTIN) 875-125 MG tablet Take 1 tablet by mouth every 12 (twelve) hours. 01/23/22   Theron Arista, PA-C  ?cetirizine (ZYRTEC) 10 MG tablet Take 10 mg by mouth daily. 07/10/20   [provider]  ?methylPREDNISolone (MEDROL DOSEPAK) 4 MG TBPK tablet 6 day dose pack - take as directed 10/10/20   Hyatt, Max T, DPM  ?mupirocin ointment (BACTROBAN) 2 % Apply 1 application topically 2 (two) times daily. To affected area till better 01/27/22   Zenia Resides, MD  ? ? ?Family History ?Family History  ?Problem Relation Age of Onset  ? Asthma Other   ? Asthma Other   ? Asthma Sister   ? Asthma Brother   ? Other Neg Hx   ? Alcohol abuse Neg Hx   ? Arthritis Neg Hx   ? Birth defects Neg Hx   ? Cancer Neg Hx   ? COPD Neg  Hx   ? Depression Neg Hx   ? Diabetes Neg Hx   ? Drug abuse Neg Hx   ? Early death Neg Hx   ? Hearing loss Neg Hx   ? Heart disease Neg Hx   ? Hyperlipidemia Neg Hx   ? Hypertension Neg Hx   ? Kidney disease Neg Hx   ? Learning disabilities Neg Hx   ? Mental illness Neg Hx   ? Mental retardation Neg Hx   ? Miscarriages / Stillbirths Neg Hx   ? Stroke Neg Hx   ? Vision loss Neg Hx   ? ? ?Social History ?Social History  ? ?Tobacco Use  ? Smoking status: Every Day  ?  Packs/day: 0.10  ?  Years: 4.00  ?  Pack years: 0.40  ?  Types: Cigars, Cigarettes  ? Smokeless tobacco: Never  ? Tobacco comments:  ?  2 cigs/day  ?Vaping Use  ? Vaping Use: Never used  ?Substance Use Topics  ? Alcohol use: No  ? Drug use: Yes  ?  Types: Marijuana  ? ? ? ?Allergies   ?Patient has no known allergies. ? ? ?Review of Systems ?Review of Systems  ?Respiratory:  Negative for shortness of breath.   ?All other systems reviewed and are negative. ? ? ?Physical Exam ?Triage Vital Signs ?ED Triage  Vitals [03/30/22 1045]  ?Enc Vitals Group  ?   BP 133/77  ?   Pulse Rate 66  ?   Resp 16  ?   Temp 98.6 ?F (37 ?C)  ?   Temp Source Oral  ?   SpO2 100 %  ?   Weight   ?   Height   ?   Head Circumference   ?   Peak Flow   ?   Pain Score   ?   Pain Loc   ?   Pain Edu?   ?   Excl. in GC?   ? ?No data found. ? ?Updated Vital Signs ?BP 133/77 (BP Location: Left Arm)   Pulse 66   Temp 98.6 ?F (37 ?C) (Oral)   Resp 16   LMP 03/19/2022   SpO2 100%  ? ?Visual Acuity ?Right Eye Distance:   ?Left Eye Distance:   ?Bilateral Distance:   ? ?Right Eye Near:   ?Left Eye Near:    ?Bilateral Near:    ? ?Physical Exam ?Vitals and nursing note reviewed.  ?Constitutional:   ?   Appearance: She is well-developed.  ?HENT:  ?   Head: Normocephalic.  ?Eyes:  ?   Extraocular Movements: Extraocular movements intact.  ?   Pupils: Pupils are equal, round, and reactive to light.  ?   Comments: Injected left conjunctiva  ?Pulmonary:  ?   Effort: Pulmonary effort is normal.  ?Abdominal:  ?   General: There is no distension.  ?Musculoskeletal:     ?   General: Normal range of motion.  ?   Cervical back: Normal range of motion.  ?Skin: ?   General: Skin is warm.  ?Neurological:  ?   General: No focal deficit present.  ?   Mental Status: She is alert and oriented to person, place, and time.  ?Psychiatric:     ?   Mood and Affect: Mood normal.  ? ? ? ?UC Treatments / Results  ?Labs ?(all labs ordered are listed, but only abnormal results are displayed) ?Labs Reviewed - No data to display ? ?EKG ? ? ?Radiology ?No results found. ? ?  Procedures ?Procedures (including critical care time) ? ?Medications Ordered in UC ?Medications - No data to display ? ?Initial Impression / Assessment and Plan / UC Course  ?I have reviewed the triage vital signs and the nursing notes. ? ?Pertinent labs & imaging results that were available during my care of the patient were reviewed by me and considered in my medical decision making (see chart for details). ? ?   ? ?MDM:  differential diagnosis  allergic vs infectious conjunctivitis.  I will treat with tobrex.  I advised try zyrtec or other otc allergy medication ?Final Clinical Impressions(s) / UC Diagnoses  ? ?Final diagnoses:  ?Acute conjunctivitis of left eye, unspecified acute conjunctivitis type  ? ?Discharge Instructions   ?None ?  ? ?ED Prescriptions   ? ? Medication Sig Dispense Auth. Provider  ? tobramycin (TOBREX) 0.3 % ophthalmic solution Place 1 drop into the right eye every 4 (four) hours for 10 days. 5 mL Elson AreasSofia, Faydra Korman K, [place]PA-C  ? ?  ? ?PDMP not reviewed this encounter. ?An After Visit Summary was printed and given to the patient.  ?  ?Elson AreasSofia, Nelsy Madonna K, PA-C ?03/30/22 1205 ? ?

## 2023-09-29 ENCOUNTER — Ambulatory Visit
Admission: RE | Admit: 2023-09-29 | Discharge: 2023-09-29 | Disposition: A | Discharge status: Home / Self Care | Payer: No Typology Code available for payment source | Source: Ambulatory Visit | Attending: Family | Admitting: Family

## 2023-09-29 ENCOUNTER — Other Ambulatory Visit: Payer: Self-pay | Admitting: Family

## 2023-09-29 DIAGNOSIS — M79632 Pain in left forearm: Secondary | ICD-10-CM

## 2023-09-29 DIAGNOSIS — R2232 Localized swelling, mass and lump, left upper limb: Secondary | ICD-10-CM

## 2023-10-06 ENCOUNTER — Other Ambulatory Visit: Payer: Self-pay | Admitting: Family

## 2023-10-06 ENCOUNTER — Encounter: Payer: Self-pay | Admitting: Family

## 2023-10-06 DIAGNOSIS — M79632 Pain in left forearm: Secondary | ICD-10-CM

## 2023-10-15 ENCOUNTER — Ambulatory Visit
Admission: RE | Admit: 2023-10-15 | Discharge: 2023-10-15 | Disposition: A | Discharge status: Home / Self Care | Payer: No Typology Code available for payment source | Source: Ambulatory Visit | Attending: Family | Admitting: Family

## 2023-10-15 DIAGNOSIS — M79632 Pain in left forearm: Secondary | ICD-10-CM
